# Patient Record
Sex: Male | Born: 1935 | Race: White | Hispanic: No | Marital: Single | Smoking: Former smoker
Health system: Southern US, Community
[De-identification: ages and names within clinical notes are randomized; demographics above are authoritative.]

## PROBLEM LIST (undated history)

## (undated) DIAGNOSIS — N289 Disorder of kidney and ureter, unspecified: Secondary | ICD-10-CM

## (undated) DIAGNOSIS — E1129 Type 2 diabetes mellitus with other diabetic kidney complication: Secondary | ICD-10-CM

## (undated) DIAGNOSIS — N183 Chronic kidney disease, stage 3 unspecified: Secondary | ICD-10-CM

## (undated) DIAGNOSIS — R0989 Other specified symptoms and signs involving the circulatory and respiratory systems: Secondary | ICD-10-CM

## (undated) DIAGNOSIS — E1140 Type 2 diabetes mellitus with diabetic neuropathy, unspecified: Secondary | ICD-10-CM

## (undated) DIAGNOSIS — B351 Tinea unguium: Secondary | ICD-10-CM

## (undated) DIAGNOSIS — E1142 Type 2 diabetes mellitus with diabetic polyneuropathy: Secondary | ICD-10-CM

## (undated) DIAGNOSIS — Z0000 Encounter for general adult medical examination without abnormal findings: Secondary | ICD-10-CM

## (undated) DIAGNOSIS — E669 Obesity, unspecified: Secondary | ICD-10-CM

## (undated) DIAGNOSIS — E782 Mixed hyperlipidemia: Secondary | ICD-10-CM

## (undated) DIAGNOSIS — E785 Hyperlipidemia, unspecified: Secondary | ICD-10-CM

## (undated) DIAGNOSIS — E739 Lactose intolerance, unspecified: Secondary | ICD-10-CM

## (undated) DIAGNOSIS — Z8619 Personal history of other infectious and parasitic diseases: Secondary | ICD-10-CM

## (undated) DIAGNOSIS — M5430 Sciatica, unspecified side: Secondary | ICD-10-CM

## (undated) DIAGNOSIS — K5790 Diverticulosis of intestine, part unspecified, without perforation or abscess without bleeding: Secondary | ICD-10-CM

## (undated) DIAGNOSIS — M4800 Spinal stenosis, site unspecified: Secondary | ICD-10-CM

## (undated) DIAGNOSIS — L0390 Cellulitis, unspecified: Secondary | ICD-10-CM

## (undated) DIAGNOSIS — E1149 Type 2 diabetes mellitus with other diabetic neurological complication: Secondary | ICD-10-CM

## (undated) DIAGNOSIS — R609 Edema, unspecified: Secondary | ICD-10-CM

## (undated) DIAGNOSIS — K219 Gastro-esophageal reflux disease without esophagitis: Secondary | ICD-10-CM

## (undated) DIAGNOSIS — E1165 Type 2 diabetes mellitus with hyperglycemia: Secondary | ICD-10-CM

## (undated) DIAGNOSIS — M1990 Unspecified osteoarthritis, unspecified site: Secondary | ICD-10-CM

## (undated) DIAGNOSIS — L0231 Cutaneous abscess of buttock: Secondary | ICD-10-CM

## (undated) DIAGNOSIS — I10 Essential (primary) hypertension: Secondary | ICD-10-CM

## (undated) HISTORY — DX: Essential (primary) hypertension: I10

## (undated) HISTORY — DX: Type 2 diabetes mellitus with other diabetic kidney complication: E11.29

## (undated) HISTORY — DX: Spinal stenosis, site unspecified: M48.00

## (undated) HISTORY — DX: Type 2 diabetes mellitus with diabetic neuropathy, unspecified: E11.40

## (undated) HISTORY — DX: Chronic kidney disease, stage 3 unspecified: N18.30

## (undated) HISTORY — DX: Gastro-esophageal reflux disease without esophagitis: K21.9

## (undated) HISTORY — DX: Lactose intolerance, unspecified: E73.9

## (undated) HISTORY — DX: Tinea unguium: B35.1

## (undated) HISTORY — DX: Type 2 diabetes mellitus with hyperglycemia: E11.65

## (undated) HISTORY — DX: Chronic kidney disease, stage 3 (moderate): N18.3

## (undated) HISTORY — DX: Sciatica, unspecified side: M54.30

## (undated) HISTORY — DX: Cutaneous abscess of buttock: L02.31

## (undated) HISTORY — PX: EYE SURGERY: SHX253

## (undated) HISTORY — PX: CATARACT EXTRACTION: SUR2

## (undated) HISTORY — PX: STERIOD INJECTION: SHX5046

## (undated) HISTORY — PX: RIB FRACTURE SURGERY: SHX2358

## (undated) HISTORY — DX: Cellulitis, unspecified: L03.90

## (undated) HISTORY — DX: Encounter for general adult medical examination without abnormal findings: Z00.00

## (undated) HISTORY — DX: Obesity, unspecified: E66.9

## (undated) HISTORY — DX: Type 2 diabetes mellitus with other diabetic neurological complication: E11.49

## (undated) HISTORY — DX: Disorder of kidney and ureter, unspecified: N28.9

## (undated) HISTORY — DX: Other specified symptoms and signs involving the circulatory and respiratory systems: R09.89

## (undated) HISTORY — DX: Diverticulosis of intestine, part unspecified, without perforation or abscess without bleeding: K57.90

## (undated) HISTORY — DX: Edema, unspecified: R60.9

## (undated) HISTORY — DX: Mixed hyperlipidemia: E78.2

## (undated) HISTORY — DX: Unspecified osteoarthritis, unspecified site: M19.90

## (undated) HISTORY — DX: Type 2 diabetes mellitus with diabetic polyneuropathy: E11.42

## (undated) HISTORY — PX: CYSTOSCOPY: SUR368

## (undated) HISTORY — DX: Hyperlipidemia, unspecified: E78.5

## (undated) HISTORY — DX: Personal history of other infectious and parasitic diseases: Z86.19

---

## 1957-08-19 HISTORY — PX: TONSILLECTOMY: SUR1361

## 1998-06-19 HISTORY — PX: COSMETIC SURGERY: SHX468

## 2000-08-19 HISTORY — PX: JOINT REPLACEMENT: SHX530

## 2000-08-19 HISTORY — PX: TOTAL HIP ARTHROPLASTY: SHX124

## 2001-02-23 ENCOUNTER — Ambulatory Visit (HOSPITAL_COMMUNITY): Admission: RE | Admit: 2001-02-23 | Discharge: 2001-02-23 | Payer: Self-pay | Admitting: Neurosurgery

## 2001-02-23 ENCOUNTER — Encounter: Payer: Self-pay | Admitting: Neurosurgery

## 2001-04-06 ENCOUNTER — Encounter: Payer: Self-pay | Admitting: Orthopedic Surgery

## 2001-04-09 ENCOUNTER — Encounter: Payer: Self-pay | Admitting: Orthopedic Surgery

## 2001-04-09 ENCOUNTER — Inpatient Hospital Stay (HOSPITAL_COMMUNITY): Admission: RE | Admit: 2001-04-09 | Discharge: 2001-04-18 | Payer: Self-pay | Admitting: Orthopedic Surgery

## 2001-04-23 ENCOUNTER — Ambulatory Visit (HOSPITAL_COMMUNITY): Admission: RE | Admit: 2001-04-23 | Discharge: 2001-04-23 | Payer: Self-pay | Admitting: Orthopedic Surgery

## 2002-09-07 ENCOUNTER — Encounter: Admission: RE | Admit: 2002-09-07 | Discharge: 2002-12-06 | Payer: Self-pay | Admitting: Internal Medicine

## 2006-02-10 ENCOUNTER — Encounter: Admission: RE | Admit: 2006-02-10 | Discharge: 2006-02-10 | Payer: Self-pay | Admitting: Internal Medicine

## 2011-10-17 ENCOUNTER — Ambulatory Visit (INDEPENDENT_AMBULATORY_CARE_PROVIDER_SITE_OTHER): Payer: MEDICARE | Admitting: General Surgery

## 2011-10-17 ENCOUNTER — Encounter (INDEPENDENT_AMBULATORY_CARE_PROVIDER_SITE_OTHER): Payer: Self-pay | Admitting: General Surgery

## 2011-10-17 VITALS — BP 140/70 | HR 72 | Temp 97.6°F | Resp 18 | Ht 71.0 in | Wt 234.6 lb

## 2011-10-17 DIAGNOSIS — T07XXXA Unspecified multiple injuries, initial encounter: Secondary | ICD-10-CM

## 2011-10-17 DIAGNOSIS — T148XXA Other injury of unspecified body region, initial encounter: Secondary | ICD-10-CM

## 2011-10-17 NOTE — Progress Notes (Signed)
Patient ID: Kyle Herman, male   DOB: Mar 12, 1936, 76 y.o.   MRN: [id number]  Chief Complaint  Patient presents with  . Follow-up    Buttock abscess    HPI Kyle Herman is a 76 y.o. male.  This patient was referred for evaluation of a nonhealing wound on his left buttock region. He states it has been present for approximately one year ever since he scratched off a lesion on his buttock region and since then he has had persistent drainage from the area. This is a nonhealing despite applying Neosporin daily as recommended by his permit a physician his main complaint is itching and burning in the area. He has a cortical uncontrolled diabetic with a sugars over 300 most times. HPI  Past Medical History  Diagnosis Date  . Type II or unspecified type diabetes mellitus with neurological manifestations, uncontrolled   . Diabetes mellitus   . Type II or unspecified type diabetes mellitus with renal manifestations, uncontrolled   . Chronic kidney disease, stage III (moderate)   . Polyneuropathy in diabetes   . Obesity, unspecified   . Abscess of buttock, left     Past Surgical History  Procedure Date  . Eye surgery 2011, 2012    Bilateral cataract removal  . Joint replacement 2002    left hip replaced  . Tonsillectomy 1959    Family History  Problem Relation Age of Onset  . Diabetes Sister   . Diabetes Brother   . Cancer Brother     Social History History  Substance Use Topics  . Smoking status: Former Smoker    Quit date: 10/16/1956  . Smokeless tobacco: Former Neurosurgeon    Quit date: 10/17/1971  . Alcohol Use: No    Allergies  Allergen Reactions  . Dairy Digestive Other (See Comments)    GI upset  . Diclofenac Sodium Rash    Face only  . Lopid (Gemfibrozil) Other (See Comments)    Neuropathy left thigh  . Zetia (Ezetimibe) Other (See Comments)    Myalgias  . Penicillins     Patient unsure of reaction. Happened many years ago.  . Metformin And Related Diarrhea  .  Simvastatin     Myalgias    Current Outpatient Prescriptions  Medication Sig Dispense Refill  . aspirin 81 MG tablet Take 81 mg by mouth daily.      . Cholecalciferol (VITAMIN D) 1000 UNITS capsule Take 1,000 Units by mouth daily.      Marland Kitchen neomycin-polymyxin-hydrocortisone (CORTISPORIN) 3.5-10000-1 otic suspension Place 2 drops into both ears as needed.      . Omega-3 Fatty Acids (FISH OIL) 1000 MG CAPS Take by mouth 3 (three) times daily.      . rosuvastatin (CRESTOR) 10 MG tablet Take 10 mg by mouth daily.      . Tamsulosin HCl (FLOMAX) 0.4 MG CAPS Take 0.4 mg by mouth daily.        Review of Systems Review of Systems All other review of systems negative or noncontributory except as stated in the HPI  Blood pressure 140/70, pulse 72, temperature 97.6 F (36.4 C), temperature source Temporal, resp. rate 18, height 5\' 11"  (1.803 m), weight 234 lb 9.6 oz (106.414 kg).  Physical Exam Physical Exam No acute distress nontoxic appearing  He has a 3cm x 3cm shallow ulcer on the left upper buttock region with healthy granulation tissue.  He also has some excoriation of the skin adjacent to the area with a midline fissure. There  is no evidence of fluctuance or induration or cellulitis or other sign of infection. Data Reviewed   Assessment    Nonhealing left buttock wound This is a clean, shallow ulcer which has been present for a year but there is no evidence of any active infection. The wound is very moist and I think that this is contributing to a lot of his symptoms of itching and burning and he needed to have some fungal infection but it is hard to tell today because the wound is moist. I would not recommend any skin grafted area and creating a second wound especially given his poorly controlled diabetes. I think that is also a large reason why this is not healed this point     Plan    I recommended daily dressing changes with dry gauze and to stop the Neosporin. I would like him to  try this wound up. I have also recommended that he be seen in the wound care clinic for further evaluation. Also adequate good control would be beneficial for healing the wound.        Lodema Pilot DAVID 10/17/2011, 3:58 PM

## 2011-10-23 ENCOUNTER — Encounter (HOSPITAL_BASED_OUTPATIENT_CLINIC_OR_DEPARTMENT_OTHER): Payer: Medicare Other | Attending: Plastic Surgery

## 2011-10-23 DIAGNOSIS — L89309 Pressure ulcer of unspecified buttock, unspecified stage: Secondary | ICD-10-CM | POA: Insufficient documentation

## 2011-10-23 DIAGNOSIS — L8990 Pressure ulcer of unspecified site, unspecified stage: Secondary | ICD-10-CM | POA: Insufficient documentation

## 2011-10-24 NOTE — Progress Notes (Signed)
[facility]  NAME:  Kyle Herman, Kyle Herman                 ACCOUNT NO.:  [id number]  MEDICAL RECORD NO.:  [id number]      DATE OF BIRTH:  09/08/1935  PHYSICIAN:  Wayland Denis, DO       VISIT DATE:  10/23/2011                                  OFFICE VISIT   CHIEF COMPLAINT:  Sacral ulcer.  HISTORY OF PRESENT ILLNESS:  Mr. Kyle Herman is a 76 year old gentleman who was referred to [place] by Dr. Pamala Hurry, general surgeon for evaluation of a nonhealing left buttock ulcer in the inferior portion of the buttock area.  He states that he has had it for a year.  The best that he can remember he felt something back there and scratched it, and never sense it has been a nonhealing ulcer.  He has also had a hip replacement on that side, so he does a lot of sitting, but he is ambulatory.  He has tried some Neosporin, which seemed to irritate it followed by steroid cream, and then most recently a dry dressing.  He does have diabetes which is poorly controlled.  The wound seems to be getting worse instead of better and has a surrounding rash as well.  PAST MEDICAL HISTORY:  Positive for: 1. Diabetes. 2. Renal disease. 3. Polyneuropathy. 4. Obesity. 5. Hyperlipidemia.  PAST SURGICAL HISTORY:  Bilateral cataract removal, left hip replacement, tonsillectomy.  FAMILY HISTORY:  Positive for diabetes and cancer in his sister and brother.  SOCIAL HISTORY:  He is a former smoker, but quit 40+ years ago and quit tobacco use 39 years ago.  He denies any alcohol use and he lives at home.  ALLERGIES: 1. DICLOFENAC. 2. LOPID. 3. ZETIA. 4. PENICILLIN. 5. METFORMIN. 6. SIMVASTATIN.  MEDICATIONS:  Aspirin, vitamin D, neomycin, omega-3 fatty acid, Crestor, Flomax.  REVIEW OF SYSTEMS:  He denies any significant change in his weight and recent illness, blood in his urine or stool, difficulty breathing or chest pain.  PHYSICAL EXAMINATION:  He is alert, oriented, and cooperative, not in any  acute distress.  He is accompanied by his son.  His pupils are equal.  His extraocular muscles are intact.  He does not have any cervical lymphadenopathy.  His breathing is unlabored.  His heart is regular.  His upper extremity pulses are strong and regular.  He has dry skin throughout.  His abdomen is large, but soft and nontender, unable to appreciate organomegaly.  Lower extremity; very mild edema, but not pitting.  Varicose veins noted.  His sacral area has a moderately superficial ulcer that goes down to subcutaneous tissue some, but not fat.  The full description is noted in the notes.  He has the periwound skin breakdown and looks a bit like a fungal infection with some raised redness and several areas of skin breakdown, so it looks like there are several things happening.  This is certainly concerning for a skin cancer due to the length of time that he has had this and the different stages of the surrounding area.  Punch biopsy with a 4-mm punch biopsy was done after a time-out was called and that information is noted in the notes as well.  We will use Aquacel AG for the following week.  He is encouraged multivitamin  him staying off the area, also wanted to check a hemoglobin A1c, prealbumin and he can use Lotrimin for the surrounding area that looks to be fungal and follow up in 1 week.     Wayland Denis, DO     CS/MEDQ  D:  10/23/2011  T:  10/24/2011  Job:  [id number]

## 2011-10-31 NOTE — Progress Notes (Signed)
[facility]  NAME:  KIPPER, BUCH NO.:  [id number]  MEDICAL RECORD NO.:  [id number]      DATE OF BIRTH:  [birth year]  PHYSICIAN:  Wayland Denis, DO       VISIT DATE:  10/30/2011                                  OFFICE VISIT   The patient is a 76 year old gentleman who is here for followup on his left gluteal ulcer.  We used Promogran last week with DuoDERM.  The biopsies came back negative for malignancy and were read as ulcer with inflamed granulation tissue.  His hemoglobin A1c came back at 11.11, so it is extremely high, but his prealbumin was high at 32.1, and overall, the wound does look better.  The peri-wound area is less irritated and red, and there is less of a rash than last week.  There has been no change in his medications.  REVIEW OF SYSTEMS:  Negative.  PHYSICAL EXAMINATION:  GENERAL:  He is alert and oriented, cooperative, not in any acute distress.  He is pleasant. HEENT:  Pupils are equal.  Extraocular muscles are intact. NECK:  No cervical lymphadenopathy. LUNGS:  His breathing is unlabored. HEART:  Regular. ABDOMEN:  Large but soft.  The wound is noted above and described in the notes.  We will continue with Promogran and DuoDERM.  Recommend that he stay off of it as much as possible.  Increase protein.  Decrease sugar and fat and multivitamin.     Wayland Denis, DO     CS/MEDQ  D:  10/30/2011  T:  10/31/2011  Job:  [id number]

## 2011-11-07 NOTE — Progress Notes (Signed)
[facility]  NAME:  DELNO, BLAISDELL NO.:  [id number]  MEDICAL RECORD NO.:  [id number]      DATE OF BIRTH:  02/10/1936  PHYSICIAN:  Wayland Denis, DO       VISIT DATE:  11/06/2011                                  OFFICE VISIT   Kyle Herman is a 76 year old gentleman who has pressure ulcer on his left buttock area.  He has had difficulty with getting this healed.  He tries to stay off it, but it is difficult to do.  He has been using Promogran and DuoDERM, and it does show some improvement since the last visit. There is still some periwound redness, but overall, it is improving. There is no change in his medication.  SOCIAL HISTORY:  Unchanged.  PHYSICAL EXAMINATION:  He is alert and oriented, cooperative, not in any acute distress.  He is very pleasant.  His pupils are equal. Extraocular muscles are intact.  No cervical lymphadenopathy.  His breathing is unlabored.  His heart is regular.  His abdomen is soft.  We will continue with Promogran and DuoDERM and we will have him follow up in 1 week.     Wayland Denis, DO     CS/MEDQ  D:  11/06/2011  T:  11/07/2011  Job:  [id number]

## 2011-11-14 NOTE — Progress Notes (Signed)
[facility]  NAME:  Kyle Herman, Kyle Herman NO.:  [id number]  MEDICAL RECORD NO.:  [id number]      DATE OF BIRTH:  July 21, 1936  PHYSICIAN:  Wayland Denis, DO       VISIT DATE:  11/13/2011                                  OFFICE VISIT   The patient is a 76 year old gentleman who is here for followup on his left sacral ulcer.  He has been using collagen and Hydrogel.  The entire area has improved, particularly the periwound area.  The actual wound is improved and is smaller and epithelializing, so overall he is doing much better.  There has been no change in his medications or social history.  PHYSICAL EXAMINATION:  GENERAL:  He is a alert and oriented, cooperative, not in any acute distress.  He is pleasant. HEENT:  Pupils are equal.  Extraocular muscles are intact. NECK:  No cervical lymphadenopathy. LUNGS:  His breathing is unlabored. CARDIAC:  His heart is regular. ABDOMEN:  Soft. EXTREMITIES:  He does complain of left lower extremity pain, but does feel better when he puts it down.  We will continue with the collagen and Hydrogel and have him follow up in 3 weeks.  Of course, he needs to be sure not to smoke, increase his protein and stay off of the area.     Wayland Denis, DO     CS/MEDQ  D:  11/13/2011  T:  11/14/2011  Job:  [id number]

## 2011-11-20 ENCOUNTER — Encounter (HOSPITAL_BASED_OUTPATIENT_CLINIC_OR_DEPARTMENT_OTHER): Payer: Medicare Other

## 2011-12-04 ENCOUNTER — Encounter (HOSPITAL_BASED_OUTPATIENT_CLINIC_OR_DEPARTMENT_OTHER): Payer: Medicare Other | Attending: Plastic Surgery

## 2011-12-04 DIAGNOSIS — L98499 Non-pressure chronic ulcer of skin of other sites with unspecified severity: Secondary | ICD-10-CM | POA: Insufficient documentation

## 2011-12-04 NOTE — Progress Notes (Signed)
[facility]  NAME:  IZIC, STFORT NO.:  [id number]  MEDICAL RECORD NO.:  [id number]      DATE OF BIRTH:  1936/01/14  PHYSICIAN:  Wayland Denis, DO       VISIT DATE:  12/04/2011                                  OFFICE VISIT   The patient is a 76 year old gentleman who is here for followup on his sacral ulcer.  He has been using collagen and Hydrogel with very good success and has at this point completely healed.  On exam, he is alert, oriented, cooperative, not in any acute distress. He is pleasant.  His pupils are equal.  His extraocular muscles are intact.  He does not have any cervical lymphadenopathy.  The wound is healed.  Recommend continuing with positioning and offloading as he could have some breakdown if he does not take care this over the next 6 weeks.  He acknowledged understanding and agreeing with the plan and we will see him back as needed.     Wayland Denis, DO     CS/MEDQ  D:  12/04/2011  T:  12/04/2011  Job:  [id number]

## 2012-12-08 ENCOUNTER — Other Ambulatory Visit: Payer: Self-pay | Admitting: Nephrology

## 2012-12-08 DIAGNOSIS — N184 Chronic kidney disease, stage 4 (severe): Secondary | ICD-10-CM

## 2012-12-10 ENCOUNTER — Ambulatory Visit
Admission: RE | Admit: 2012-12-10 | Discharge: 2012-12-10 | Disposition: A | Discharge status: Home / Self Care | Payer: Medicare Other | Source: Ambulatory Visit | Attending: Nephrology | Admitting: Nephrology

## 2012-12-10 DIAGNOSIS — N184 Chronic kidney disease, stage 4 (severe): Secondary | ICD-10-CM

## 2012-12-16 ENCOUNTER — Ambulatory Visit
Admission: RE | Admit: 2012-12-16 | Discharge: 2012-12-16 | Disposition: A | Discharge status: Home / Self Care | Payer: Medicare Other | Source: Ambulatory Visit | Attending: Internal Medicine | Admitting: Internal Medicine

## 2012-12-16 ENCOUNTER — Other Ambulatory Visit: Payer: Self-pay | Admitting: Internal Medicine

## 2012-12-16 DIAGNOSIS — R42 Dizziness and giddiness: Secondary | ICD-10-CM

## 2013-02-05 ENCOUNTER — Other Ambulatory Visit: Payer: Self-pay

## 2013-02-05 ENCOUNTER — Encounter: Payer: Self-pay | Admitting: Vascular Surgery

## 2013-03-04 ENCOUNTER — Encounter: Payer: Self-pay | Admitting: Vascular Surgery

## 2013-03-05 ENCOUNTER — Other Ambulatory Visit (INDEPENDENT_AMBULATORY_CARE_PROVIDER_SITE_OTHER): Payer: Medicare Other | Admitting: *Deleted

## 2013-03-05 ENCOUNTER — Encounter: Payer: Self-pay | Admitting: Vascular Surgery

## 2013-03-05 ENCOUNTER — Ambulatory Visit (INDEPENDENT_AMBULATORY_CARE_PROVIDER_SITE_OTHER): Payer: Medicare Other | Admitting: Vascular Surgery

## 2013-03-05 DIAGNOSIS — I6529 Occlusion and stenosis of unspecified carotid artery: Secondary | ICD-10-CM

## 2013-03-05 MED ORDER — CLOPIDOGREL BISULFATE 75 MG PO TABS: 75.0000 mg | ORAL_TABLET | Freq: Every day | ORAL | Status: DC

## 2013-03-05 NOTE — Progress Notes (Signed)
[facility]  New Carotid Patient  Referred by:  Marden Noble, MD [address] [place], [place] [address]  Reason for referral: Left carotid stenosis  History of Present Illness  Kyle Herman is a 77 y.o. ([birth year]) male who presents with chief complaint: "narrowing in neck".  Previous carotid studies demonstrated: RICA 30-40% stenosis, LICA 50-69% stenosis.  Patient has no history of TIA or stroke symptom.  The patient has nevern had amaurosis fugax or monocular blindness.  The patient has never had facial drooping or hemiplegia.  The patient has never had receptive or expressive aphasia.   The patient's risks factors for carotid disease include: IDDM, HTN and prior smoking.  The patient has been on chronic surveillance and only recently has had progression in his studies.  Pt notes most recent GFR < 15 at nephrologist's office.  The patient has stated he does not want hemodialysis.  Past Medical History  Diagnosis Date  . Type II or unspecified type diabetes mellitus with neurological manifestations, uncontrolled(250.62)   . Diabetes mellitus   . Type II or unspecified type diabetes mellitus with renal manifestations, uncontrolled(250.42)   . Chronic kidney disease, stage III (moderate)   . Polyneuropathy in diabetes(357.2)   . Obesity, unspecified   . Abscess of buttock, left   . Hypertension   . Spinal stenosis   . Sciatica   . H/O onychomycosis   . Left carotid bruit   . Diverticulosis   . DJD (degenerative joint disease)     Past Surgical History  Procedure Laterality Date  . Eye surgery  2011, 2012    Bilateral cataract removal  . Joint replacement  2002    left hip replaced  . Tonsillectomy  1959  . Total hip arthroplasty Left 2002  . Cosmetic surgery  06/1998    for a chain saw injury to left leg    History   Social History  . Marital Status: Single    Spouse Name: N/A    Number of Children: N/A  . Years of  Education: N/A   Occupational History  . Not on file.   Social History Main Topics  . Smoking status: Former Smoker    Quit date: 10/16/1996  . Smokeless tobacco: Former Neurosurgeon    Quit date: 10/17/1971  . Alcohol Use: No  . Drug Use: No  . Sexually Active: Not on file   Other Topics Concern  . Not on file   Social History Narrative  . No narrative on file    Family History  Problem Relation Age of Onset  . Diabetes Sister   . Hypertension Sister   . Diabetes Brother   . Cancer Brother   . Hypertension Brother   . Diabetes Mother   . Hypertension Mother   . Heart attack Mother   . Diabetes Father   . Hypertension Father   . Heart attack Father     Current Outpatient Prescriptions on File Prior to Visit  Medication Sig Dispense Refill  . aspirin 81 MG tablet Take 81 mg by mouth daily.      . Cholecalciferol (VITAMIN D) 1000 UNITS capsule Take 1,000 Units by mouth daily.      . insulin NPH-regular (HUMULIN 70/30) (70-30) 100 UNIT/ML injection Inject 48 Units into the skin 3 (three) times daily.      . Omega-3 Fatty Acids (FISH OIL) 1000 MG CAPS Take by mouth 3 (three) times daily.      Marland Kitchen  rosuvastatin (CRESTOR) 10 MG tablet Take 10 mg by mouth daily.      . Tamsulosin HCl (FLOMAX) 0.4 MG CAPS Take 0.4 mg by mouth daily.      Marland Kitchen linagliptin (TRADJENTA) 5 MG TABS tablet Take 5 mg by mouth daily.      Marland Kitchen neomycin-polymyxin-hydrocortisone (CORTISPORIN) 3.5-10000-1 otic suspension Place 2 drops into both ears as needed.      . traMADol (ULTRAM) 50 MG tablet Take 50 mg by mouth every 6 (six) hours as needed for pain.       No current facility-administered medications on file prior to visit.    Allergies  Allergen Reactions  . Diclofenac Sodium Rash    Face only  . Lactase Other (See Comments)    GI upset  . Lopid (Gemfibrozil) Other (See Comments)    Neuropathy left thigh  . Zetia (Ezetimibe) Other (See Comments)    Myalgias  . Penicillins     Patient unsure of  reaction. Happened many years ago.  . Metformin And Related Diarrhea  . Simvastatin     Myalgias    REVIEW OF SYSTEMS:  (Positives checked otherwise negative)  CARDIOVASCULAR:  []  chest pain, []  chest pressure, []  palpitations, []  shortness of breath when laying flat, []  shortness of breath with exertion,  [x]  pain in feet when walking, [x]  pain in feet when laying flat, []  history of blood clot in veins (DVT), []  history of phlebitis, []  swelling in legs, []  varicose veins  PULMONARY:  []  productive cough, []  asthma, []  wheezing  NEUROLOGIC:  [x]  weakness in arms or legs, [x]  numbness in arms or legs, []  difficulty speaking or slurred speech, []  temporary loss of vision in one eye, [x]  dizziness  HEMATOLOGIC:  []  bleeding problems, []  problems with blood clotting too easily  MUSCULOSKEL:  []  joint pain, []  joint swelling  GASTROINTEST:  []  vomiting blood, []  blood in stool     GENITOURINARY:  []  burning with urination, [x]  blood in urine  PSYCHIATRIC:  []  history of major depression  INTEGUMENTARY:  [x]  rashes, []  ulcers  CONSTITUTIONAL:  []  fever, []  chills  For VQI Use Only  PRE-ADM LIVING: Home  AMB STATUS: Ambulatory  CAD Sx: None  PRIOR CHF: None  STRESS TEST: [x]  No, [ ]  Normal, [ ]  + ischemia, [ ]  + MI, [ ]  Both  Physical Examination  Filed Vitals:   03/05/13 0905 03/05/13 0907  BP: 177/78 172/73  Pulse: 80   Height: 5\' 11"  (1.803 m)   Weight: 234 lb (106.142 kg)   SpO2: 97%    Body mass index is 32.65 kg/(m^2).  General: A&O x 3, WD, Obese,   Head: [place]/AT  Ear/Nose/Throat: Hearing grossly intact, nares w/o erythema or drainage, oropharynx w/o Erythema/Exudate, Mallampati score:   Eyes: PERRLA, EOMI, Post surg chg to pupils,   Neck: Supple, no nuchal rigidity, no palpable LAD  Pulmonary: Sym exp, good air movt, CTAB, no rales, rhonchi, & wheezing  Cardiac: RRR, Nl S1, S2, no Murmurs, rubs or gallops  Vascular: Vessel Right Left  Radial   Palpable  Palpable  Brachial  Palpable  Palpable  Carotid  Palpable, without bruit  Palpable, without bruit  Aorta  Not palpable N/A  Femoral  Palpable  Palpable  Popliteal  Not palpable  Not palpable  PT Faintly Palpable  Faintly Palpable  DP Not Palpable Not Palpable   Gastrointestinal: soft, NTND, -G/R, - HSM, - masses, - CVAT B, large pannus, unable to palpate aorta  Musculoskeletal: M/S 5/5 throughout , Extremities without ischemic changes   Neurologic: CN 2-12 intact , Pain and light touch intact in extremities except decreased sensation in feet, Motor exam as listed above  Psychiatric: Judgment intact, Mood & affect appropriate for pt's clinical situation  Dermatologic: See M/S exam for extremity exam, no rashes otherwise noted  Lymph : No Cervical, Axillary, or Inguinal lymphadenopathy   Non-Invasive Vascular Imaging  L CAROTID DUPLEX (Date: 03/05/2013):   L CCA: 481/178 c/s  L ICA stenosis: 60-79%  L VA:  patent and antegrade  Outside Studies/Documentation 10 pages of outside documents were reviewed including: outside clinic chart and labs reviewed.  Medical Decision Making  Kyle Herman is a 77 y.o. male who presents with: asx likely high grade L CCA and ICA stenoses, low grade R ICA, CKD stage V   The high grade CCA stenosis will decrease the velocity at the level of the LICA, suggesting the LICA is likely >80%.  The only way to confirm the extent and degree of stenosis would be a L carotid angiogram or neck CTA.  He is not a candidate for CTA due to his stage V CKD.  Unfortunately, this patient is already stage V with no intent to go on to hemodialysis, so contrast induce nephropathy from the carotid angiogram could be a death sentence for this patient.  I do not feel comfortable operating on this patient without knowing the extent of his disease, as if he has proximal CCA disease, a proximal carotid stent placement might be needed in addition to L  CEA.  Additional, expert opinion is that a patient doesn't really benefit from CEA unless a 3-5 year lifespan can be expected.  I'm not certain in this patient that he will meet this time frame.  At this point, the pt is not interested in proceeding with L carotid angiogram, so we will proceed with maximal medical mgmt.  I discussed in depth with the patient the nature of atherosclerosis, and emphasized the importance of maximal medical management including strict control of blood pressure, blood glucose, and lipid levels, obtaining regular exercise, antiplatelet agents, and cessation of smoking.    The patient is currently on an antiplatelet: ASA.  I am also adding Plavix to his regimen.  The patient is currently on a statin: Crestor.    The patient is aware that without maximal medical management the underlying atherosclerotic disease process will progress, limiting the benefit of any interventions.  The pt will contact us if he changes his mind.  Thank you for allowing [place] to participate in this patient's care.  Leonides Sake, MD [facility] Office: [phone] Pager: [phone]  03/05/2013, 9:47 AM

## 2013-03-10 ENCOUNTER — Encounter: Payer: Self-pay | Admitting: Family Medicine

## 2013-05-25 ENCOUNTER — Other Ambulatory Visit (HOSPITAL_COMMUNITY): Payer: Self-pay | Admitting: *Deleted

## 2013-05-26 ENCOUNTER — Ambulatory Visit (HOSPITAL_COMMUNITY)
Admission: RE | Admit: 2013-05-26 | Discharge: 2013-05-26 | Disposition: A | Discharge status: Home / Self Care | Payer: Medicare Other | Source: Ambulatory Visit | Attending: Nephrology | Admitting: Nephrology

## 2013-05-26 DIAGNOSIS — D509 Iron deficiency anemia, unspecified: Secondary | ICD-10-CM | POA: Insufficient documentation

## 2013-05-26 MED ORDER — SODIUM CHLORIDE 0.9 % IV SOLN
1020.0000 mg | Freq: Once | INTRAVENOUS | Status: AC
  Administered 2013-05-26: 1020 mg via INTRAVENOUS
  Filled 2013-05-26: qty 34

## 2013-06-02 ENCOUNTER — Encounter (HOSPITAL_COMMUNITY): Payer: Self-pay | Admitting: Emergency Medicine

## 2013-06-02 ENCOUNTER — Emergency Department (HOSPITAL_COMMUNITY)
Admission: EM | Admit: 2013-06-02 | Discharge: 2013-06-03 | Disposition: A | Discharge status: Home / Self Care | Payer: Medicare Other | Attending: Emergency Medicine | Admitting: Emergency Medicine

## 2013-06-02 DIAGNOSIS — E1165 Type 2 diabetes mellitus with hyperglycemia: Secondary | ICD-10-CM | POA: Insufficient documentation

## 2013-06-02 DIAGNOSIS — E669 Obesity, unspecified: Secondary | ICD-10-CM | POA: Insufficient documentation

## 2013-06-02 DIAGNOSIS — G8929 Other chronic pain: Secondary | ICD-10-CM | POA: Insufficient documentation

## 2013-06-02 DIAGNOSIS — Z794 Long term (current) use of insulin: Secondary | ICD-10-CM | POA: Insufficient documentation

## 2013-06-02 DIAGNOSIS — E1129 Type 2 diabetes mellitus with other diabetic kidney complication: Secondary | ICD-10-CM | POA: Insufficient documentation

## 2013-06-02 DIAGNOSIS — R609 Edema, unspecified: Secondary | ICD-10-CM

## 2013-06-02 DIAGNOSIS — Z8719 Personal history of other diseases of the digestive system: Secondary | ICD-10-CM | POA: Insufficient documentation

## 2013-06-02 DIAGNOSIS — Z87891 Personal history of nicotine dependence: Secondary | ICD-10-CM | POA: Insufficient documentation

## 2013-06-02 DIAGNOSIS — I509 Heart failure, unspecified: Secondary | ICD-10-CM | POA: Insufficient documentation

## 2013-06-02 DIAGNOSIS — Z880 Allergy status to penicillin: Secondary | ICD-10-CM | POA: Insufficient documentation

## 2013-06-02 DIAGNOSIS — Z79899 Other long term (current) drug therapy: Secondary | ICD-10-CM | POA: Insufficient documentation

## 2013-06-02 DIAGNOSIS — L02419 Cutaneous abscess of limb, unspecified: Secondary | ICD-10-CM | POA: Insufficient documentation

## 2013-06-02 DIAGNOSIS — Z8619 Personal history of other infectious and parasitic diseases: Secondary | ICD-10-CM | POA: Insufficient documentation

## 2013-06-02 DIAGNOSIS — E1142 Type 2 diabetes mellitus with diabetic polyneuropathy: Secondary | ICD-10-CM | POA: Insufficient documentation

## 2013-06-02 DIAGNOSIS — E1149 Type 2 diabetes mellitus with other diabetic neurological complication: Secondary | ICD-10-CM | POA: Insufficient documentation

## 2013-06-02 DIAGNOSIS — L0390 Cellulitis, unspecified: Secondary | ICD-10-CM

## 2013-06-02 DIAGNOSIS — Z7982 Long term (current) use of aspirin: Secondary | ICD-10-CM | POA: Insufficient documentation

## 2013-06-02 DIAGNOSIS — N183 Chronic kidney disease, stage 3 unspecified: Secondary | ICD-10-CM | POA: Insufficient documentation

## 2013-06-02 DIAGNOSIS — I129 Hypertensive chronic kidney disease with stage 1 through stage 4 chronic kidney disease, or unspecified chronic kidney disease: Secondary | ICD-10-CM | POA: Insufficient documentation

## 2013-06-02 LAB — CBC
HCT: 34.8 % — ABNORMAL LOW (ref 39.0–52.0)
Hemoglobin: 11.7 g/dL — ABNORMAL LOW (ref 13.0–17.0)
MCH: 29.8 pg (ref 26.0–34.0)
MCHC: 33.6 g/dL (ref 30.0–36.0)
MCV: 88.8 fL (ref 78.0–100.0)
RBC: 3.92 MIL/uL — ABNORMAL LOW (ref 4.22–5.81)
RDW: 14.7 % (ref 11.5–15.5)
WBC: 13 10*3/uL — ABNORMAL HIGH (ref 4.0–10.5)

## 2013-06-02 LAB — BASIC METABOLIC PANEL
BUN: 53 mg/dL — ABNORMAL HIGH (ref 6–23)
Chloride: 96 mEq/L (ref 96–112)
Creatinine, Ser: 3.67 mg/dL — ABNORMAL HIGH (ref 0.50–1.35)
GFR calc Af Amer: 17 mL/min — ABNORMAL LOW (ref >90)
GFR calc non Af Amer: 15 mL/min — ABNORMAL LOW (ref >90)
Glucose, Bld: 281 mg/dL — ABNORMAL HIGH (ref 70–99)

## 2013-06-02 LAB — POCT I-STAT TROPONIN I

## 2013-06-02 NOTE — ED Notes (Signed)
Pt states he has been having swelling in both of his legs that is causing pain, and caused him to fall in the bathroom.

## 2013-06-03 ENCOUNTER — Encounter (HOSPITAL_COMMUNITY): Payer: Self-pay | Admitting: Emergency Medicine

## 2013-06-03 ENCOUNTER — Emergency Department (HOSPITAL_COMMUNITY): Payer: Medicare Other

## 2013-06-03 LAB — URINALYSIS, ROUTINE W REFLEX MICROSCOPIC
Bilirubin Urine: NEGATIVE
Glucose, UA: 250 mg/dL — AB
Ketones, ur: NEGATIVE mg/dL
Leukocytes, UA: NEGATIVE
Nitrite: NEGATIVE
Specific Gravity, Urine: 1.021 (ref 1.005–1.030)
pH: 5.5 (ref 5.0–8.0)

## 2013-06-03 LAB — DIFFERENTIAL
Basophils Absolute: 0 10*3/uL (ref 0.0–0.1)
Eosinophils Relative: 0 % (ref 0–5)
Lymphocytes Relative: 5 % — ABNORMAL LOW (ref 12–46)
Lymphs Abs: 0.7 10*3/uL (ref 0.7–4.0)
Monocytes Absolute: 0.9 10*3/uL (ref 0.1–1.0)
Neutro Abs: 11.4 10*3/uL — ABNORMAL HIGH (ref 1.7–7.7)

## 2013-06-03 LAB — URINE MICROSCOPIC-ADD ON

## 2013-06-03 MED ORDER — HYDROCODONE-ACETAMINOPHEN 5-325 MG PO TABS: 1.0000 | ORAL_TABLET | ORAL | Status: AC | PRN

## 2013-06-03 MED ORDER — HYDROCODONE-ACETAMINOPHEN 5-325 MG PO TABS
1.0000 | ORAL_TABLET | Freq: Once | ORAL | Status: AC
  Administered 2013-06-03: 1 via ORAL
  Filled 2013-06-03: qty 1

## 2013-06-03 MED ORDER — CEPHALEXIN 500 MG PO CAPS: 500.0000 mg | ORAL_CAPSULE | Freq: Four times a day (QID) | ORAL | Status: AC

## 2013-06-03 NOTE — ED Provider Notes (Signed)
CSN: [id number]     Arrival date & time 06/02/13  1841 History   First MD Initiated Contact with Patient 06/02/13 2347     Chief Complaint  Patient presents with  . Leg Swelling  . Congestive Heart Failure   (Consider location/radiation/quality/duration/timing/severity/associated sxs/prior Treatment) Patient is a 77 y.o. male presenting with leg pain. The history is provided by the patient. No language interpreter was used.  Leg Pain Location:  Leg Leg location:  L lower leg and R lower leg Chronicity:  Chronic Associated symptoms: no fever   Associated symptoms comment:  He has had pain and swelling in the lower legs chronically that is increasingly painful. He reports having been put on Lasix with increasing doses without relief. No SOB, chest pain. Tonight he could not hold up his weight and slid to the floor. Per his son, he does not take anything for pain because of his kidney disease.   Past Medical History  Diagnosis Date  . Type II or unspecified type diabetes mellitus with neurological manifestations, uncontrolled(250.62)   . Diabetes mellitus   . Type II or unspecified type diabetes mellitus with renal manifestations, uncontrolled(250.42)   . Chronic kidney disease, stage III (moderate)   . Polyneuropathy in diabetes(357.2)   . Obesity, unspecified   . Abscess of buttock, left   . Hypertension   . Spinal stenosis   . Sciatica   . H/O onychomycosis   . Left carotid bruit   . Diverticulosis   . DJD (degenerative joint disease)    Past Surgical History  Procedure Laterality Date  . Eye surgery  2011, 2012    Bilateral cataract removal  . Joint replacement  2002    left hip replaced  . Tonsillectomy  1959  . Total hip arthroplasty Left 2002  . Cosmetic surgery  06/1998    for a chain saw injury to left leg   Family History  Problem Relation Age of Onset  . Diabetes Sister   . Hypertension Sister   . Diabetes Brother   . Cancer Brother   . Hypertension  Brother   . Diabetes Mother   . Hypertension Mother   . Heart attack Mother   . Diabetes Father   . Hypertension Father   . Heart attack Father    History  Substance Use Topics  . Smoking status: Former Smoker    Quit date: 10/16/1996  . Smokeless tobacco: Former Neurosurgeon    Quit date: 10/17/1971  . Alcohol Use: No    Review of Systems  Constitutional: Negative for fever and chills.  HENT: Negative.   Respiratory: Negative.  Negative for shortness of breath.   Cardiovascular: Positive for leg swelling. Negative for chest pain.  Gastrointestinal: Negative.   Musculoskeletal:       See HPI  Skin: Negative.   Neurological: Negative.     Allergies  Diclofenac sodium; Lactase; Lopid; Zetia; Penicillins; Metformin and related; and Simvastatin  Home Medications   Current Outpatient Rx  Name  Route  Sig  Dispense  Refill  . aspirin 81 MG tablet   Oral   Take 81 mg by mouth daily.         . carvedilol (COREG) 12.5 MG tablet   Oral   Take 1 tablet by mouth 2 (two) times daily.         . Cholecalciferol (VITAMIN D) 1000 UNITS capsule   Oral   Take 1,000 Units by mouth daily.         Marland Kitchen  clopidogrel (PLAVIX) 75 MG tablet   Oral   Take 1 tablet (75 mg total) by mouth daily.   30 tablet   11   . doxazosin (CARDURA) 4 MG tablet   Oral   Take 4 mg by mouth.          . furosemide (LASIX) 40 MG tablet   Oral   Take 0.5 tablets by mouth daily.          . hydrALAZINE (APRESOLINE) 25 MG tablet   Oral   Take 1 tablet by mouth 3 (three) times daily.         . insulin NPH-regular (HUMULIN 70/30) (70-30) 100 UNIT/ML injection   Subcutaneous   Inject 36 Units into the skin 3 (three) times daily.          . metoCLOPramide (REGLAN) 5 MG tablet   Oral   Take 5 mg by mouth 3 (three) times daily.         Marland Kitchen neomycin-polymyxin-hydrocortisone (CORTISPORIN) 3.5-10000-1 otic suspension   Both Ears   Place 2 drops into both ears as needed.         . Omega-3 Fatty  Acids (FISH OIL) 1000 MG CAPS   Oral   Take 1 capsule by mouth at bedtime.          . potassium chloride (K-DUR) 10 MEQ tablet   Oral   Take 10 mEq by mouth daily.         . rosuvastatin (CRESTOR) 10 MG tablet   Oral   Take 10 mg by mouth daily.         . Tamsulosin HCl (FLOMAX) 0.4 MG CAPS   Oral   Take 0.4 mg by mouth daily.         . vitamin B-12 (CYANOCOBALAMIN) 1000 MCG tablet   Oral   Take 1,000 mcg by mouth daily.          BP 152/73  Pulse 77  Temp(Src) 98.6 F (37 C) (Oral)  Resp 16  Wt 236 lb 12.8 oz (107.412 kg)  BMI 33.04 kg/m2  SpO2 95% Physical Exam  Constitutional: He is oriented to person, place, and time. He appears well-developed and well-nourished. No distress.  HENT:  Head: Normocephalic.  Neck: Normal range of motion. Neck supple.  Cardiovascular: Normal rate and regular rhythm.   Pulmonary/Chest: Effort normal and breath sounds normal.  Abdominal: Soft. Bowel sounds are normal. There is no tenderness. There is no rebound and no guarding.  Musculoskeletal: Normal range of motion.  Lower legs and feet swollen bilaterally, equally tender. Left leg mildly erythematous. No drainage, blistering or lesion.   Neurological: He is alert and oriented to person, place, and time.  Skin: Skin is warm and dry. There is erythema.  Psychiatric: He has a normal mood and affect.    ED Course  Procedures (including critical care time) Labs Review Labs Reviewed  CBC - Abnormal; Notable for the following:    WBC 13.0 (*)    RBC 3.92 (*)    Hemoglobin 11.7 (*)    HCT 34.8 (*)    All other components within normal limits  BASIC METABOLIC PANEL - Abnormal; Notable for the following:    Sodium 134 (*)    Glucose, Bld 281 (*)    BUN 53 (*)    Creatinine, Ser 3.67 (*)    GFR calc non Af Amer 15 (*)    GFR calc Af Amer 17 (*)    All other components  within normal limits  PRO B NATRIURETIC PEPTIDE - Abnormal; Notable for the following:    Pro B  Natriuretic peptide (BNP) 562.5 (*)    All other components within normal limits  URINALYSIS, ROUTINE W REFLEX MICROSCOPIC  POCT I-STAT TROPONIN I   Results for orders placed during the hospital encounter of 06/02/13  CBC      Result Value Range   WBC 13.0 (*) 4.0 - 10.5 K/uL   RBC 3.92 (*) 4.22 - 5.81 MIL/uL   Hemoglobin 11.7 (*) 13.0 - 17.0 g/dL   HCT 16.1 (*) 09.6 - 04.5 %   MCV 88.8  78.0 - 100.0 fL   MCH 29.8  26.0 - 34.0 pg   MCHC 33.6  30.0 - 36.0 g/dL   RDW 40.9  81.1 - 91.4 %   Platelets 204  150 - 400 K/uL  BASIC METABOLIC PANEL      Result Value Range   Sodium 134 (*) 135 - 145 mEq/L   Potassium 4.4  3.5 - 5.1 mEq/L   Chloride 96  96 - 112 mEq/L   CO2 23  19 - 32 mEq/L   Glucose, Bld 281 (*) 70 - 99 mg/dL   BUN 53 (*) 6 - 23 mg/dL   Creatinine, Ser 7.82 (*) 0.50 - 1.35 mg/dL   Calcium 9.2  8.4 - 95.6 mg/dL   GFR calc non Af Amer 15 (*) >90 mL/min   GFR calc Af Amer 17 (*) >90 mL/min  PRO B NATRIURETIC PEPTIDE      Result Value Range   Pro B Natriuretic peptide (BNP) 562.5 (*) 0 - 125 pg/mL  DIFFERENTIAL      Result Value Range   Neutrophils Relative % 87 (*) 43 - 77 %   Neutro Abs 11.4 (*) 1.7 - 7.7 K/uL   Lymphocytes Relative 5 (*) 12 - 46 %   Lymphs Abs 0.7  0.7 - 4.0 K/uL   Monocytes Relative 7  3 - 12 %   Monocytes Absolute 0.9  0.1 - 1.0 K/uL   Eosinophils Relative 0  0 - 5 %   Eosinophils Absolute 0.0  0.0 - 0.7 K/uL   Basophils Relative 0  0 - 1 %   Basophils Absolute 0.0  0.0 - 0.1 K/uL  POCT I-STAT TROPONIN I      Result Value Range   Troponin i, poc 0.02  0.00 - 0.08 ng/mL   Comment 3            Dg Chest 2 View  06/03/2013   CLINICAL DATA:  Pain and swelling of legs. History of CHF, diabetes and obesity.  EXAM: CHEST  2 VIEW  COMPARISON:  None.  FINDINGS: Borderline enlarged heart. No infiltrates or failure. No effusion or pneumothorax. Negative osseous structures. Moderate tortuosity of the aorta.  IMPRESSION: Borderline cardiomegaly, no active  disease.   Electronically Signed   By: Davonna Belling M.D.   On: 06/03/2013 01:24   Imaging Review No results found.  EKG Interpretation   None       MDM  No diagnosis found. 1. Peripheral edema 2. Mild left LE cellulitis   Chronic lower extremity pain and swelling, currently being treated by PCP, however with mild leukocytosis and slightly red left greater than right LE. Consider mild cellulitis. He is ambulating well, without imbalance. No fever. Discussed with family who is comfortable with caring for patient at home. Will start on abx. Follow up with PCP in 1-2 days for  recheck.    Arnoldo Hooker, PA-C 06/03/13 [phone]

## 2013-06-03 NOTE — ED Notes (Signed)
Patient transported to X-ray 

## 2013-06-03 NOTE — ED Provider Notes (Signed)
Medical screening examination/treatment/procedure(s) were conducted as a shared visit with non-physician practitioner(s) and myself.  I personally evaluated the patient during the encounter.  Patient is a 77 y.o. male with a history of congestive heart failure who presents emergency department with bilateral lower extremity swelling and pain that has been present for several months. He reports today when he stood up his pain was worse. Due to pain he became diaphoretic and slowly fell down to the ground. He did not strike his head. Denies any numbness, tingling or focal weakness. On exam, patient has venous stasis dermatitis, bilateral edema to the level of his knees, 2+ DP pulses bilaterally, slightly diminished sensation to light touch which he reports is chronic. There is mild erythema and warmth of the left lower extremity compared to the right. Patient is hemodynamically stable. He does have a mild leukocytosis with left shift. Given this is a chronic condition, do not feel he needs diuresis or ultrasound of his lower extremities at this time. Have offered admission but family would like discharge home. We'll discharge on antibiotics for early cellulitis. Given return precautions. Patient and family verbalized understanding and are comfortable with plan.   Layla Maw Russie Gulledge, DO 06/03/13 [phone]

## 2014-02-14 ENCOUNTER — Encounter: Payer: Self-pay | Admitting: *Deleted

## 2014-02-28 ENCOUNTER — Other Ambulatory Visit: Payer: Self-pay | Admitting: *Deleted

## 2014-02-28 DIAGNOSIS — I739 Peripheral vascular disease, unspecified: Secondary | ICD-10-CM

## 2014-02-28 MED ORDER — CLOPIDOGREL BISULFATE 75 MG PO TABS: 75.0000 mg | ORAL_TABLET | Freq: Every day | ORAL | Status: AC

## 2014-09-14 ENCOUNTER — Other Ambulatory Visit: Payer: Self-pay

## 2014-09-14 ENCOUNTER — Emergency Department (HOSPITAL_COMMUNITY): Payer: 59

## 2014-09-14 ENCOUNTER — Encounter (HOSPITAL_COMMUNITY): Payer: Self-pay | Admitting: Emergency Medicine

## 2014-09-14 ENCOUNTER — Emergency Department (HOSPITAL_COMMUNITY)
Admission: EM | Admit: 2014-09-14 | Discharge: 2014-09-15 | Discharge status: Against Medical Advice | Payer: 59 | Attending: Emergency Medicine | Admitting: Emergency Medicine

## 2014-09-14 DIAGNOSIS — E669 Obesity, unspecified: Secondary | ICD-10-CM | POA: Diagnosis not present

## 2014-09-14 DIAGNOSIS — R0602 Shortness of breath: Secondary | ICD-10-CM | POA: Diagnosis not present

## 2014-09-14 DIAGNOSIS — R079 Chest pain, unspecified: Secondary | ICD-10-CM | POA: Diagnosis present

## 2014-09-14 DIAGNOSIS — I129 Hypertensive chronic kidney disease with stage 1 through stage 4 chronic kidney disease, or unspecified chronic kidney disease: Secondary | ICD-10-CM | POA: Diagnosis not present

## 2014-09-14 DIAGNOSIS — E1140 Type 2 diabetes mellitus with diabetic neuropathy, unspecified: Secondary | ICD-10-CM | POA: Diagnosis not present

## 2014-09-14 DIAGNOSIS — R2241 Localized swelling, mass and lump, right lower limb: Secondary | ICD-10-CM | POA: Diagnosis not present

## 2014-09-14 DIAGNOSIS — E1149 Type 2 diabetes mellitus with other diabetic neurological complication: Secondary | ICD-10-CM | POA: Diagnosis not present

## 2014-09-14 DIAGNOSIS — E1129 Type 2 diabetes mellitus with other diabetic kidney complication: Secondary | ICD-10-CM | POA: Diagnosis not present

## 2014-09-14 DIAGNOSIS — N183 Chronic kidney disease, stage 3 (moderate): Secondary | ICD-10-CM | POA: Diagnosis not present

## 2014-09-14 LAB — COMPREHENSIVE METABOLIC PANEL
ALT: 12 U/L (ref 0–53)
AST: 15 U/L (ref 0–37)
Albumin: 3.6 g/dL (ref 3.5–5.2)
Alkaline Phosphatase: 44 U/L (ref 39–117)
Anion gap: 12 (ref 5–15)
BILIRUBIN TOTAL: 0.3 mg/dL (ref 0.3–1.2)
BUN: 79 mg/dL — ABNORMAL HIGH (ref 6–23)
CALCIUM: 8.3 mg/dL — AB (ref 8.4–10.5)
CHLORIDE: 103 mmol/L (ref 96–112)
CO2: 22 mmol/L (ref 19–32)
Creatinine, Ser: 4.42 mg/dL — ABNORMAL HIGH (ref 0.50–1.35)
GFR, EST AFRICAN AMERICAN: 14 mL/min — AB (ref >90)
GFR, EST NON AFRICAN AMERICAN: 12 mL/min — AB (ref >90)
Glucose, Bld: 270 mg/dL — ABNORMAL HIGH (ref 70–99)
POTASSIUM: 4.4 mmol/L (ref 3.5–5.1)
Sodium: 137 mmol/L (ref 135–145)
TOTAL PROTEIN: 6.8 g/dL (ref 6.0–8.3)

## 2014-09-14 LAB — CBC WITH DIFFERENTIAL/PLATELET
BASOS PCT: 0 % (ref 0–1)
Basophils Absolute: 0 10*3/uL (ref 0.0–0.1)
Eosinophils Absolute: 0.5 10*3/uL (ref 0.0–0.7)
Eosinophils Relative: 7 % — ABNORMAL HIGH (ref 0–5)
HEMATOCRIT: 28.1 % — AB (ref 39.0–52.0)
Hemoglobin: 9.1 g/dL — ABNORMAL LOW (ref 13.0–17.0)
LYMPHS ABS: 1.1 10*3/uL (ref 0.7–4.0)
LYMPHS PCT: 16 % (ref 12–46)
MCH: 29.1 pg (ref 26.0–34.0)
MCHC: 32.4 g/dL (ref 30.0–36.0)
MCV: 89.8 fL (ref 78.0–100.0)
MONO ABS: 0.5 10*3/uL (ref 0.1–1.0)
Monocytes Relative: 7 % (ref 3–12)
Neutro Abs: 4.9 10*3/uL (ref 1.7–7.7)
Neutrophils Relative %: 70 % (ref 43–77)
Platelets: 245 10*3/uL (ref 150–400)
RBC: 3.13 MIL/uL — ABNORMAL LOW (ref 4.22–5.81)
RDW: 15.1 % (ref 11.5–15.5)
WBC: 7 10*3/uL (ref 4.0–10.5)

## 2014-09-14 LAB — I-STAT TROPONIN, ED: Troponin i, poc: 0.01 ng/mL (ref 0.00–0.08)

## 2014-09-14 NOTE — ED Notes (Signed)
Pt reports central chest pain today that subsided after a short period of time- admits to shortness of breath, lungs sounds clear.  Pt also admits to right foot redness and swelling since Thursday- hx of cellulitis- PCP started pt on Keflex on Monday.  Pt speaking in full sentences at present, no distress noted.

## 2014-09-14 NOTE — ED Notes (Signed)
During rounding, pt is unable to be found in waiting room.

## 2014-09-22 ENCOUNTER — Other Ambulatory Visit: Payer: Self-pay | Admitting: Internal Medicine

## 2014-09-22 DIAGNOSIS — L03115 Cellulitis of right lower limb: Secondary | ICD-10-CM

## 2014-09-26 ENCOUNTER — Ambulatory Visit
Admission: RE | Admit: 2014-09-26 | Discharge: 2014-09-26 | Disposition: A | Discharge status: Home / Self Care | Payer: Medicare Other | Source: Ambulatory Visit | Attending: Internal Medicine | Admitting: Internal Medicine

## 2014-09-26 DIAGNOSIS — L03115 Cellulitis of right lower limb: Secondary | ICD-10-CM

## 2014-10-17 ENCOUNTER — Other Ambulatory Visit (HOSPITAL_COMMUNITY): Payer: Self-pay | Admitting: *Deleted

## 2014-10-18 ENCOUNTER — Encounter (HOSPITAL_COMMUNITY)
Admission: RE | Admit: 2014-10-18 | Discharge: 2014-10-18 | Disposition: A | Discharge status: Home / Self Care | Payer: Medicare Other | Source: Ambulatory Visit | Attending: Nephrology | Admitting: Nephrology

## 2014-10-18 DIAGNOSIS — D509 Iron deficiency anemia, unspecified: Secondary | ICD-10-CM | POA: Diagnosis not present

## 2014-10-18 MED ORDER — SODIUM CHLORIDE 0.9 % IV SOLN
510.0000 mg | INTRAVENOUS | Status: DC
  Administered 2014-10-18: 510 mg via INTRAVENOUS
  Filled 2014-10-18: qty 17

## 2014-10-25 ENCOUNTER — Encounter (HOSPITAL_COMMUNITY)
Admission: RE | Admit: 2014-10-25 | Discharge: 2014-10-25 | Disposition: A | Discharge status: Home / Self Care | Payer: Medicare Other | Source: Ambulatory Visit | Attending: Nephrology | Admitting: Nephrology

## 2014-10-25 DIAGNOSIS — D509 Iron deficiency anemia, unspecified: Secondary | ICD-10-CM | POA: Diagnosis not present

## 2014-10-25 MED ORDER — SODIUM CHLORIDE 0.9 % IV SOLN
510.0000 mg | INTRAVENOUS | Status: AC
  Administered 2014-10-25: 09:00:00 510 mg via INTRAVENOUS
  Filled 2014-10-25: qty 17

## 2014-11-14 ENCOUNTER — Other Ambulatory Visit: Payer: Self-pay | Admitting: Internal Medicine

## 2014-11-14 DIAGNOSIS — I6529 Occlusion and stenosis of unspecified carotid artery: Secondary | ICD-10-CM

## 2014-11-15 ENCOUNTER — Ambulatory Visit
Admission: RE | Admit: 2014-11-15 | Discharge: 2014-11-15 | Disposition: A | Discharge status: Home / Self Care | Payer: Medicare Other | Source: Ambulatory Visit | Attending: Internal Medicine | Admitting: Internal Medicine

## 2014-11-15 DIAGNOSIS — I6529 Occlusion and stenosis of unspecified carotid artery: Secondary | ICD-10-CM

## 2014-12-01 ENCOUNTER — Encounter: Payer: Self-pay | Admitting: Vascular Surgery

## 2014-12-01 IMAGING — CT CT HEAD W/O CM
2 series · 16 of 30 positions shown, 20 images · non-contrast
Comparison: None.

CLINICAL DATA: Disequilibrium, carotid disease, evaluate for
stroke.

CT HEAD WITHOUT CONTRAST
TECHNIQUE: Contiguous axial images were obtained from the base of
the skull through the vertex without contrast.

[Series 3: head bone · axial · 0.49mm/px · z∈[+4,+45]mm · 3 of 28 slices shown]
[im 2/28  bone]
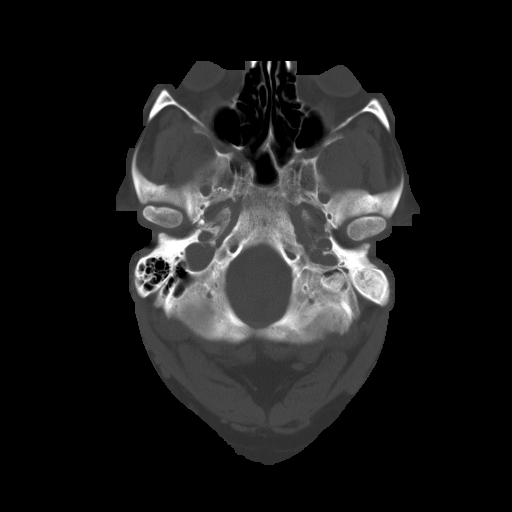
[im 6/28  bone]
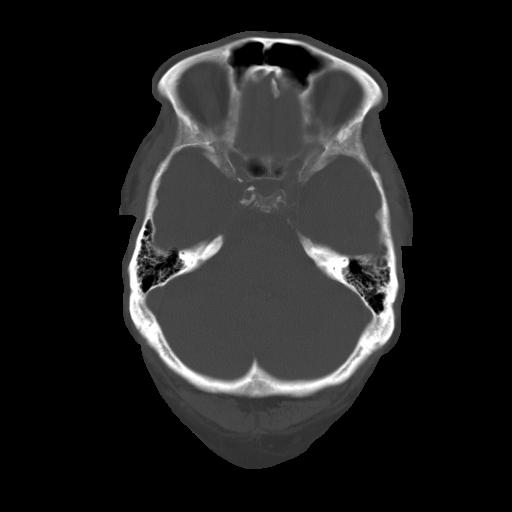
[im 10/28  bone]
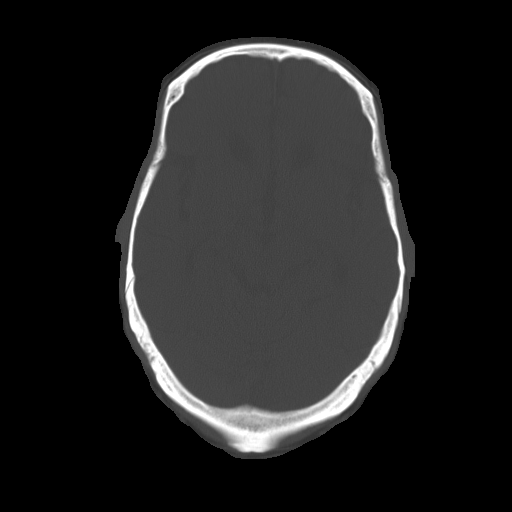

[Series 32: 3d filtered head w/o · axial · non-contrast · 0.49mm/px · z∈[+4,+128]mm · 13 of 28 slices shown, 17 images]
[im 2/28  brain]
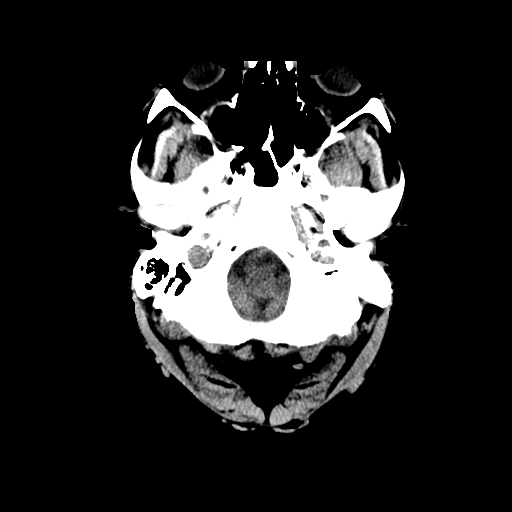
[im 2/28  bone]
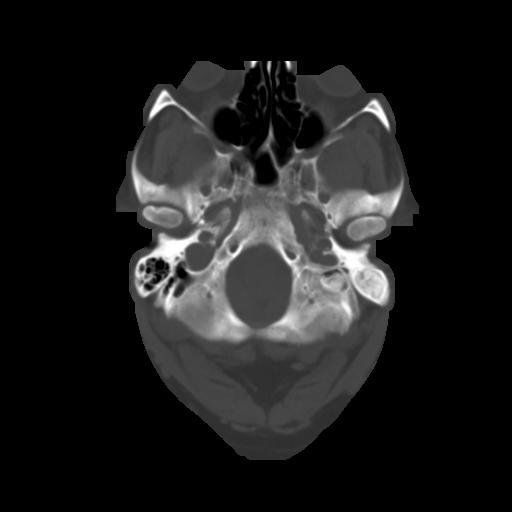
[im 4/28  brain]
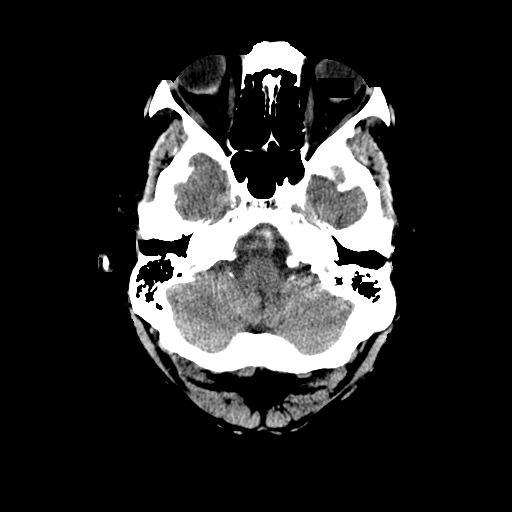
[im 6/28  brain]
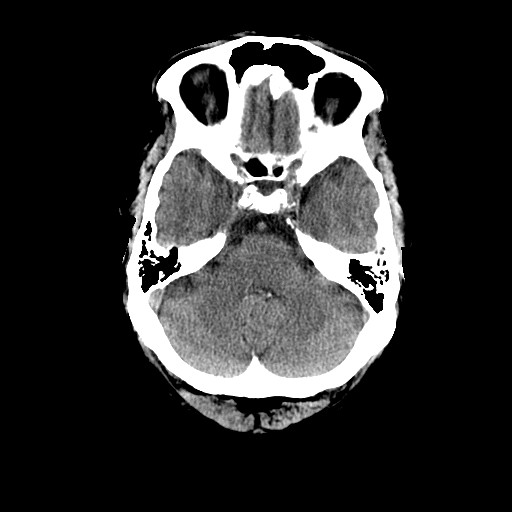
[im 8/28  brain]
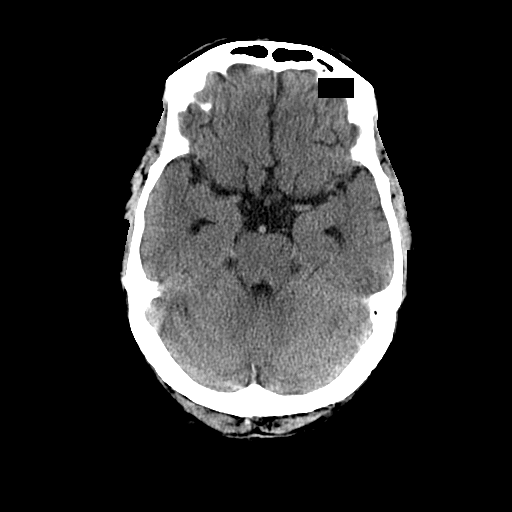
[im 10/28  brain]
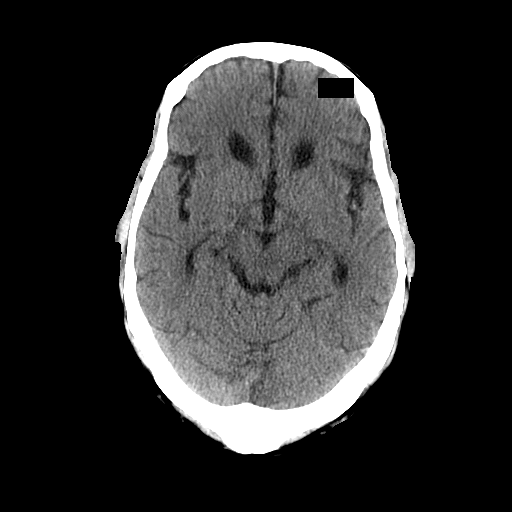
[im 10/28  bone]
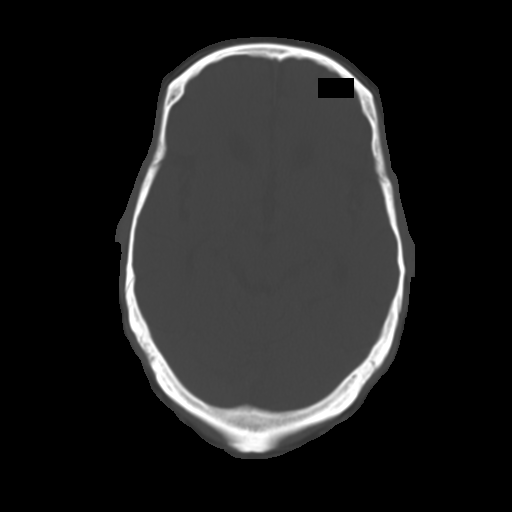
[im 12/28  brain]
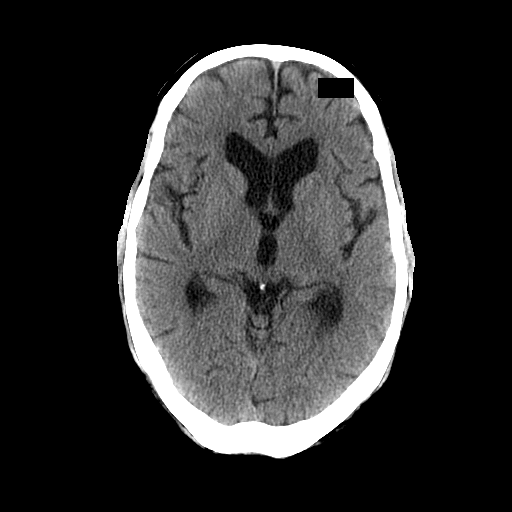
[im 14/28  brain]
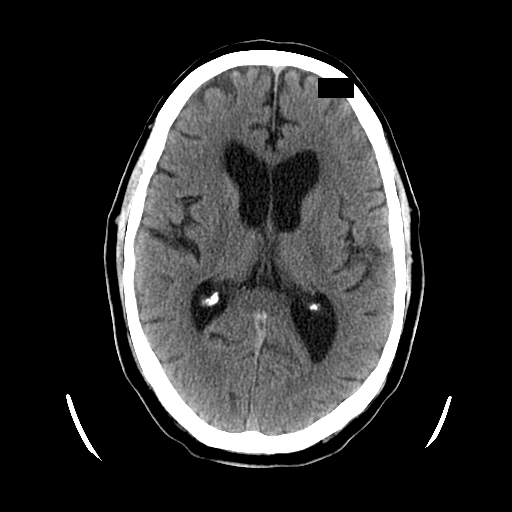
[im 16/28  brain]
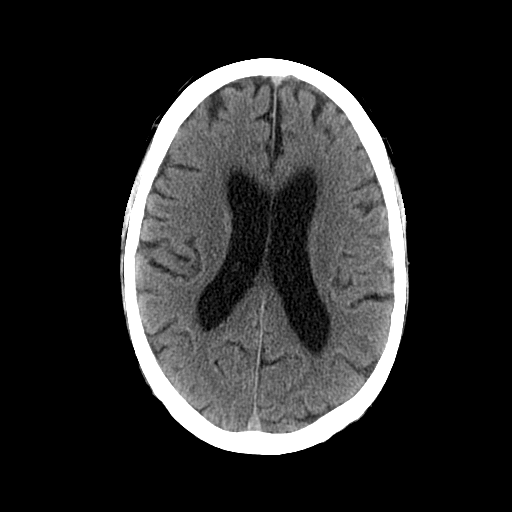
[im 18/28  brain]
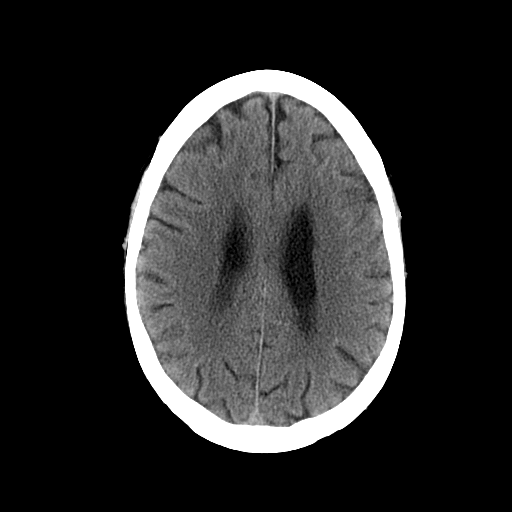
[im 18/28  bone]
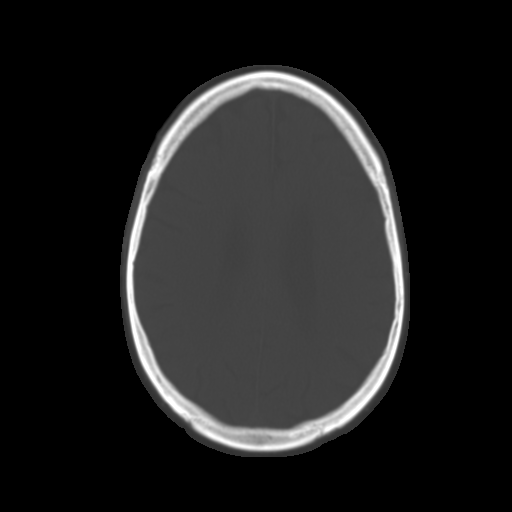
[im 20/28  brain]
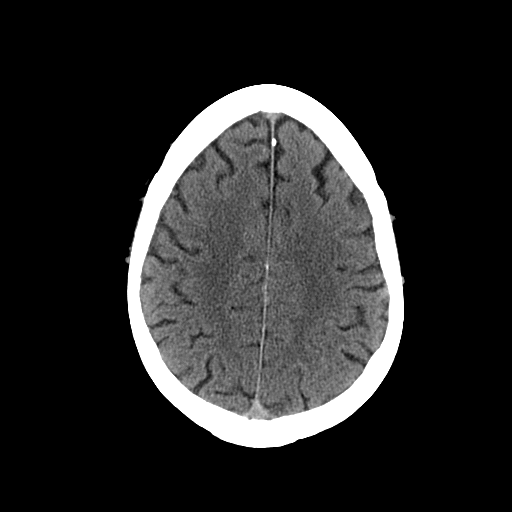
[im 22/28  brain]
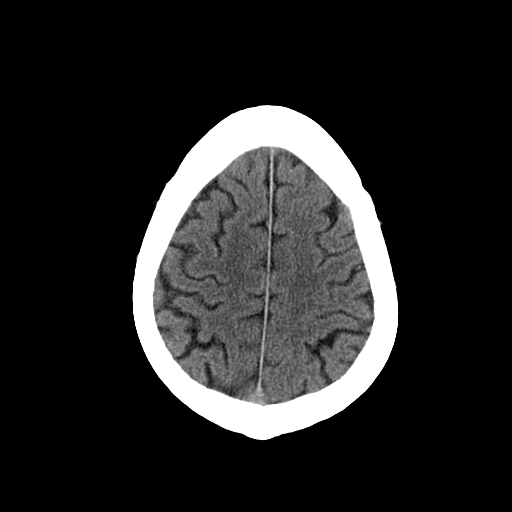
[im 24/28  brain]
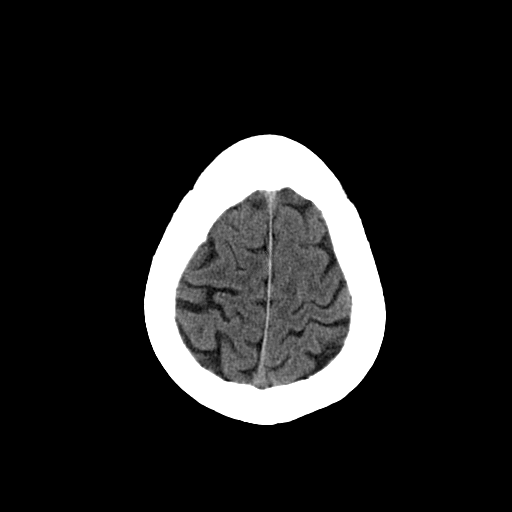
[im 26/28  brain]
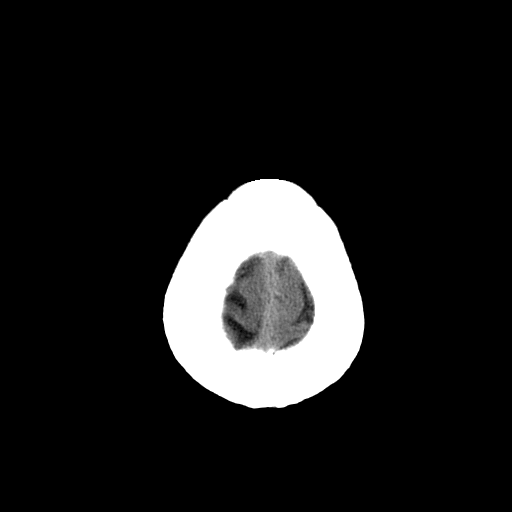
[im 26/28  bone]
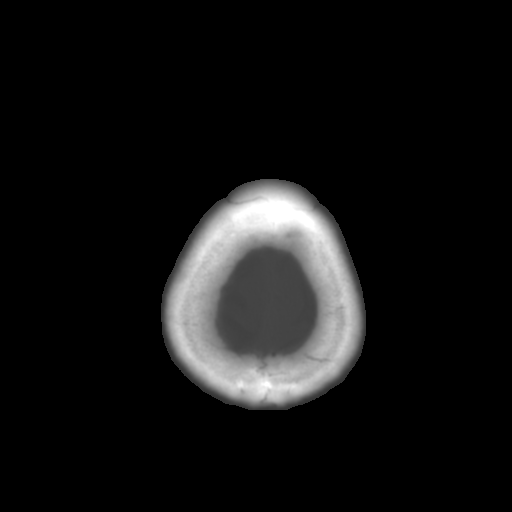

[16 of 30 positions shown; findings below may reference images not displayed]

FINDINGS: There is no evidence for acute infarction, intracranial
hemorrhage, mass lesion, hydrocephalus, or extra-axial fluid.  Mild
age related atrophy and chronic microvascular ischemic change.  No
CT signs of proximal vascular thrombosis.  No posterior circulation
infarct is evident.  Moderate vascular calcification affects the
carotid siphons.

Calvarium is intact.  Negative orbits, sinuses, and mastoids.
IMPRESSION: Chronic changes as described.  No visible acute infarction or
hemorrhage.  No CT signs of proximal vascular thrombosis.

## 2014-12-02 ENCOUNTER — Encounter: Payer: Self-pay | Admitting: Vascular Surgery

## 2014-12-02 ENCOUNTER — Ambulatory Visit (INDEPENDENT_AMBULATORY_CARE_PROVIDER_SITE_OTHER): Payer: Medicare Other | Admitting: Vascular Surgery

## 2014-12-02 VITALS — BP 135/61 | HR 70 | Ht 71.0 in | Wt 247.0 lb

## 2014-12-02 DIAGNOSIS — I6523 Occlusion and stenosis of bilateral carotid arteries: Secondary | ICD-10-CM | POA: Diagnosis not present

## 2014-12-02 NOTE — Progress Notes (Signed)
    Established Carotid Patient  History of Present Illness  Kyle Herman is a 79 y.o. (08/24/1935) male who presents with chief complaint: "don't know why I'm here."  Pt has known history of possible high grade stenosis in L ICA with R ICA stenosis <50%.  The patient was CKD stage V previously.  He needed a CTA or carotid angiogram to further evaluate the L ICA, but he declined due to his kidney disease.  At this point, his CKD has progressed further and is essentially ESRD reportedly.  He is has decided to NOT proceed with HD.  The patient's PMH, PSH, SH, FamHx, Med, and Allergies are unchanged from 03/05/13.  On ROS today: +leg swelling, denies AMS or pruritus  Physical Examination  Filed Vitals:   12/02/14 1121 12/02/14 1123 12/02/14 1129  BP: 139/59 155/54 135/61  Pulse: 72 73 70  Height: 5\' 11"  (1.803 m)    Weight: 247 lb (112.038 kg)    SpO2: 97%     Body mass index is 34.46 kg/(m^2).  General: A&O x 3, WD, Obese,   Eyes: PERRLA, EOMI, Post surg chg to pupils,   Pulmonary: Sym exp, good air movt, CTAB, no rales, rhonchi, & wheezing  Cardiac: RRR, Nl S1, S2, no Murmurs, rubs or gallops  Gastrointestinal: soft, NTND, -G/R, - HSM, - masses, - CVAT B, large pannus, unable to palpate aorta  Musculoskeletal: M/S 5/5 throughout , Extremities without ischemic changes, BLE edema 1-2+, chronic venous stasis skin changes   Neurologic: CN 2-12 intact , Pain and light touch intact in extremities except decreased sensation in feet, Motor exam as listed above  Medical Decision Making  Kyle Herman is a 79 y.o. male who presents with: asx likely high grade L CCA and ICA stenoses, low grade R ICA, CKD stage V   Pt refused B carotid duplex and follow-up  As he is also refusing HD, I suspect follow up on the carotid disease is irrelevant as his lifespan will be determined by his imminent ESRD  Available as needed.  Leonides SakeBrian , MD [facility]  [place] Office: [phone] Pager: [phone]  12/02/2014, 1:14 PM

## 2015-04-26 ENCOUNTER — Other Ambulatory Visit (HOSPITAL_COMMUNITY): Payer: Self-pay | Admitting: *Deleted

## 2015-04-27 ENCOUNTER — Encounter (HOSPITAL_COMMUNITY): Payer: Medicare Other

## 2015-05-05 ENCOUNTER — Encounter (HOSPITAL_COMMUNITY)
Admission: RE | Admit: 2015-05-05 | Discharge: 2015-05-05 | Disposition: A | Discharge status: Home / Self Care | Payer: Medicare Other | Source: Ambulatory Visit | Attending: Nephrology | Admitting: Nephrology

## 2015-05-05 DIAGNOSIS — D509 Iron deficiency anemia, unspecified: Secondary | ICD-10-CM | POA: Insufficient documentation

## 2015-05-05 MED ORDER — SODIUM CHLORIDE 0.9 % IV SOLN
510.0000 mg | INTRAVENOUS | Status: DC
  Administered 2015-05-05: 510 mg via INTRAVENOUS
  Filled 2015-05-05: qty 17

## 2015-05-05 NOTE — Discharge Instructions (Signed)
Ferumoxytol injection What is this medicine? FERUMOXYTOL is an iron complex. Iron is used to make healthy red blood cells, which carry oxygen and nutrients throughout the body. This medicine is used to treat iron deficiency anemia in people with chronic kidney disease. This medicine may be used for other purposes; ask your health care provider or pharmacist if you have questions. COMMON BRAND NAME(S): Feraheme What should I tell my health care provider before I take this medicine? They need to know if you have any of these conditions: -anemia not caused by low iron levels -high levels of iron in the blood -magnetic resonance imaging (MRI) test scheduled -an unusual or allergic reaction to iron, other medicines, foods, dyes, or preservatives -pregnant or trying to get pregnant -breast-feeding How should I use this medicine? This medicine is for injection into a vein. It is given by a health care professional in a hospital or clinic setting. Talk to your pediatrician regarding the use of this medicine in children. Special care may be needed. Overdosage: If you think you've taken too much of this medicine contact a poison control center or emergency room at once. Overdosage: If you think you have taken too much of this medicine contact a poison control center or emergency room at once. NOTE: This medicine is only for you. Do not share this medicine with others. What if I miss a dose? It is important not to miss your dose. Call your doctor or health care professional if you are unable to keep an appointment. What may interact with this medicine? This medicine may interact with the following medications: -other iron products This list may not describe all possible interactions. Give your health care provider a list of all the medicines, herbs, non-prescription drugs, or dietary supplements you use. Also tell them if you smoke, drink alcohol, or use illegal drugs. Some items may interact with your  medicine. What should I watch for while using this medicine? Visit your doctor or healthcare professional regularly. Tell your doctor or healthcare professional if your symptoms do not start to get better or if they get worse. You may need blood work done while you are taking this medicine. You may need to follow a special diet. Talk to your doctor. Foods that contain iron include: whole grains/cereals, dried fruits, beans, or peas, leafy green vegetables, and organ meats (liver, kidney). What side effects may I notice from receiving this medicine? Side effects that you should report to your doctor or health care professional as soon as possible: -allergic reactions like skin rash, itching or hives, swelling of the face, lips, or tongue -breathing problems -changes in blood pressure -feeling faint or lightheaded, falls -fever or chills -flushing, sweating, or hot feelings -swelling of the ankles or feet Side effects that usually do not require medical attention (Report these to your doctor or health care professional if they continue or are bothersome.): -diarrhea -headache -nausea, vomiting -stomach pain This list may not describe all possible side effects. Call your doctor for medical advice about side effects. You may report side effects to FDA at 1-800-FDA-1088. Where should I keep my medicine? This drug is given in a hospital or clinic and will not be stored at home. NOTE: This sheet is a summary. It may not cover all possible information. If you have questions about this medicine, talk to your doctor, pharmacist, or health care provider.  2015, Elsevier/Gold Standard. (2012-03-20 15:23:36) Ferumoxytol injection What is this medicine? FERUMOXYTOL is an iron complex. Iron is used  to make healthy red blood cells, which carry oxygen and nutrients throughout the body. This medicine is used to treat iron deficiency anemia in people with chronic kidney disease. This medicine may be used for  other purposes; ask your health care provider or pharmacist if you have questions. COMMON BRAND NAME(S): Feraheme What should I tell my health care provider before I take this medicine? They need to know if you have any of these conditions: -anemia not caused by low iron levels -high levels of iron in the blood -magnetic resonance imaging (MRI) test scheduled -an unusual or allergic reaction to iron, other medicines, foods, dyes, or preservatives -pregnant or trying to get pregnant -breast-feeding How should I use this medicine? This medicine is for injection into a vein. It is given by a health care professional in a hospital or clinic setting. Talk to your pediatrician regarding the use of this medicine in children. Special care may be needed. Overdosage: If you think you've taken too much of this medicine contact a poison control center or emergency room at once. Overdosage: If you think you have taken too much of this medicine contact a poison control center or emergency room at once. NOTE: This medicine is only for you. Do not share this medicine with others. What if I miss a dose? It is important not to miss your dose. Call your doctor or health care professional if you are unable to keep an appointment. What may interact with this medicine? This medicine may interact with the following medications: -other iron products This list may not describe all possible interactions. Give your health care provider a list of all the medicines, herbs, non-prescription drugs, or dietary supplements you use. Also tell them if you smoke, drink alcohol, or use illegal drugs. Some items may interact with your medicine. What should I watch for while using this medicine? Visit your doctor or healthcare professional regularly. Tell your doctor or healthcare professional if your symptoms do not start to get better or if they get worse. You may need blood work done while you are taking this medicine. You may  need to follow a special diet. Talk to your doctor. Foods that contain iron include: whole grains/cereals, dried fruits, beans, or peas, leafy green vegetables, and organ meats (liver, kidney). What side effects may I notice from receiving this medicine? Side effects that you should report to your doctor or health care professional as soon as possible: -allergic reactions like skin rash, itching or hives, swelling of the face, lips, or tongue -breathing problems -changes in blood pressure -feeling faint or lightheaded, falls -fever or chills -flushing, sweating, or hot feelings -swelling of the ankles or feet Side effects that usually do not require medical attention (Report these to your doctor or health care professional if they continue or are bothersome.): -diarrhea -headache -nausea, vomiting -stomach pain This list may not describe all possible side effects. Call your doctor for medical advice about side effects. You may report side effects to FDA at 1-800-FDA-1088. Where should I keep my medicine? This drug is given in a hospital or clinic and will not be stored at home. NOTE: This sheet is a summary. It may not cover all possible information. If you have questions about this medicine, talk to your doctor, pharmacist, or health care provider.  2015, Elsevier/Gold Standard. (2012-03-20 15:23:36)

## 2015-05-11 ENCOUNTER — Encounter (HOSPITAL_COMMUNITY)
Admission: RE | Admit: 2015-05-11 | Discharge: 2015-05-11 | Disposition: A | Discharge status: Home / Self Care | Payer: Medicare Other | Source: Ambulatory Visit | Attending: Nephrology | Admitting: Nephrology

## 2015-05-11 DIAGNOSIS — D509 Iron deficiency anemia, unspecified: Secondary | ICD-10-CM | POA: Diagnosis not present

## 2015-05-11 MED ORDER — SODIUM CHLORIDE 0.9 % IV SOLN
510.0000 mg | INTRAVENOUS | Status: AC
  Administered 2015-05-11: 510 mg via INTRAVENOUS
  Filled 2015-05-11: qty 17

## 2015-05-12 DIAGNOSIS — D509 Iron deficiency anemia, unspecified: Secondary | ICD-10-CM | POA: Diagnosis not present

## 2015-06-22 ENCOUNTER — Emergency Department (HOSPITAL_COMMUNITY): Payer: Medicare Other

## 2015-06-22 ENCOUNTER — Encounter (HOSPITAL_COMMUNITY): Payer: Self-pay | Admitting: Emergency Medicine

## 2015-06-22 ENCOUNTER — Emergency Department (HOSPITAL_COMMUNITY)
Admission: EM | Admit: 2015-06-22 | Discharge: 2015-06-22 | Disposition: A | Discharge status: Home / Self Care | Payer: Medicare Other | Source: Intra-hospital | Attending: Emergency Medicine | Admitting: Emergency Medicine

## 2015-06-22 DIAGNOSIS — R112 Nausea with vomiting, unspecified: Secondary | ICD-10-CM | POA: Diagnosis not present

## 2015-06-22 DIAGNOSIS — E119 Type 2 diabetes mellitus without complications: Secondary | ICD-10-CM | POA: Insufficient documentation

## 2015-06-22 DIAGNOSIS — N19 Unspecified kidney failure: Secondary | ICD-10-CM | POA: Diagnosis not present

## 2015-06-22 DIAGNOSIS — Z515 Encounter for palliative care: Secondary | ICD-10-CM

## 2015-06-22 DIAGNOSIS — E669 Obesity, unspecified: Secondary | ICD-10-CM | POA: Diagnosis not present

## 2015-06-22 DIAGNOSIS — W19XXXA Unspecified fall, initial encounter: Secondary | ICD-10-CM

## 2015-06-22 DIAGNOSIS — N186 End stage renal disease: Secondary | ICD-10-CM

## 2015-06-22 DIAGNOSIS — E782 Mixed hyperlipidemia: Secondary | ICD-10-CM | POA: Diagnosis not present

## 2015-06-22 DIAGNOSIS — Z992 Dependence on renal dialysis: Secondary | ICD-10-CM | POA: Diagnosis not present

## 2015-06-22 DIAGNOSIS — K219 Gastro-esophageal reflux disease without esophagitis: Secondary | ICD-10-CM | POA: Diagnosis not present

## 2015-06-22 DIAGNOSIS — Z794 Long term (current) use of insulin: Secondary | ICD-10-CM | POA: Diagnosis not present

## 2015-06-22 DIAGNOSIS — R531 Weakness: Secondary | ICD-10-CM

## 2015-06-22 DIAGNOSIS — Z79899 Other long term (current) drug therapy: Secondary | ICD-10-CM | POA: Diagnosis not present

## 2015-06-22 DIAGNOSIS — I120 Hypertensive chronic kidney disease with stage 5 chronic kidney disease or end stage renal disease: Secondary | ICD-10-CM | POA: Insufficient documentation

## 2015-06-22 DIAGNOSIS — Z7982 Long term (current) use of aspirin: Secondary | ICD-10-CM | POA: Insufficient documentation

## 2015-06-22 DIAGNOSIS — Z880 Allergy status to penicillin: Secondary | ICD-10-CM | POA: Diagnosis not present

## 2015-06-22 DIAGNOSIS — Z7902 Long term (current) use of antithrombotics/antiplatelets: Secondary | ICD-10-CM | POA: Insufficient documentation

## 2015-06-22 DIAGNOSIS — Z87891 Personal history of nicotine dependence: Secondary | ICD-10-CM | POA: Insufficient documentation

## 2015-06-22 DIAGNOSIS — R4182 Altered mental status, unspecified: Secondary | ICD-10-CM | POA: Diagnosis present

## 2015-06-22 LAB — COMPREHENSIVE METABOLIC PANEL
ALT: 11 U/L — ABNORMAL LOW (ref 17–63)
ANION GAP: 16 — AB (ref 5–15)
AST: 20 U/L (ref 15–41)
Albumin: 3.7 g/dL (ref 3.5–5.0)
Alkaline Phosphatase: 31 U/L — ABNORMAL LOW (ref 38–126)
BILIRUBIN TOTAL: 0.5 mg/dL (ref 0.3–1.2)
BUN: 102 mg/dL — ABNORMAL HIGH (ref 6–20)
CHLORIDE: 106 mmol/L (ref 101–111)
CO2: 16 mmol/L — ABNORMAL LOW (ref 22–32)
Calcium: 8.7 mg/dL — ABNORMAL LOW (ref 8.9–10.3)
Creatinine, Ser: 6.01 mg/dL — ABNORMAL HIGH (ref 0.61–1.24)
GFR, EST AFRICAN AMERICAN: 9 mL/min — AB (ref >60)
GFR, EST NON AFRICAN AMERICAN: 8 mL/min — AB (ref >60)
Glucose, Bld: 231 mg/dL — ABNORMAL HIGH (ref 65–99)
POTASSIUM: 3.7 mmol/L (ref 3.5–5.1)
Sodium: 138 mmol/L (ref 135–145)
TOTAL PROTEIN: 7.3 g/dL (ref 6.5–8.1)

## 2015-06-22 LAB — URINALYSIS, ROUTINE W REFLEX MICROSCOPIC
BILIRUBIN URINE: NEGATIVE
Glucose, UA: 250 mg/dL — AB
KETONES UR: NEGATIVE mg/dL
LEUKOCYTES UA: NEGATIVE
NITRITE: NEGATIVE
PH: 5 (ref 5.0–8.0)
Protein, ur: 100 mg/dL — AB
Specific Gravity, Urine: 1.015 (ref 1.005–1.030)
Urobilinogen, UA: 0.2 mg/dL (ref 0.0–1.0)

## 2015-06-22 LAB — I-STAT VENOUS BLOOD GAS, ED
Acid-base deficit: 5 mmol/L — ABNORMAL HIGH (ref 0.0–2.0)
Bicarbonate: 19.2 mEq/L — ABNORMAL LOW (ref 20.0–24.0)
O2 SAT: 81 %
PCO2 VEN: 31.7 mmHg — AB (ref 45.0–50.0)
TCO2: 20 mmol/L (ref 0–100)
pH, Ven: 7.389 — ABNORMAL HIGH (ref 7.250–7.300)
pO2, Ven: 45 mmHg (ref 30.0–45.0)

## 2015-06-22 LAB — CBC
HCT: 25.9 % — ABNORMAL LOW (ref 39.0–52.0)
Hemoglobin: 8.6 g/dL — ABNORMAL LOW (ref 13.0–17.0)
MCH: 29.8 pg (ref 26.0–34.0)
MCHC: 33.2 g/dL (ref 30.0–36.0)
MCV: 89.6 fL (ref 78.0–100.0)
PLATELETS: 203 10*3/uL (ref 150–400)
RBC: 2.89 MIL/uL — AB (ref 4.22–5.81)
RDW: 17.1 % — AB (ref 11.5–15.5)
WBC: 20.9 10*3/uL — ABNORMAL HIGH (ref 4.0–10.5)

## 2015-06-22 LAB — URINE MICROSCOPIC-ADD ON

## 2015-06-22 LAB — I-STAT CHEM 8, ED
BUN: 105 mg/dL — AB (ref 6–20)
CHLORIDE: 105 mmol/L (ref 101–111)
Calcium, Ion: 1.06 mmol/L — ABNORMAL LOW (ref 1.13–1.30)
Creatinine, Ser: 6 mg/dL — ABNORMAL HIGH (ref 0.61–1.24)
Glucose, Bld: 223 mg/dL — ABNORMAL HIGH (ref 65–99)
HEMATOCRIT: 27 % — AB (ref 39.0–52.0)
Hemoglobin: 9.2 g/dL — ABNORMAL LOW (ref 13.0–17.0)
Potassium: 3.8 mmol/L (ref 3.5–5.1)
SODIUM: 139 mmol/L (ref 135–145)
TCO2: 15 mmol/L (ref 0–100)

## 2015-06-22 LAB — DIFFERENTIAL
BASOS PCT: 0 %
Basophils Absolute: 0 10*3/uL (ref 0.0–0.1)
EOS ABS: 0 10*3/uL (ref 0.0–0.7)
EOS PCT: 0 %
LYMPHS ABS: 1.3 10*3/uL (ref 0.7–4.0)
Lymphocytes Relative: 6 %
MONO ABS: 0.6 10*3/uL (ref 0.1–1.0)
Monocytes Relative: 3 %
NEUTROS ABS: 19 10*3/uL — AB (ref 1.7–7.7)
NEUTROS PCT: 91 %
WBC MORPHOLOGY: INCREASED

## 2015-06-22 LAB — RAPID URINE DRUG SCREEN, HOSP PERFORMED
Amphetamines: NOT DETECTED
BARBITURATES: NOT DETECTED
BENZODIAZEPINES: NOT DETECTED
COCAINE: NOT DETECTED
OPIATES: NOT DETECTED
Tetrahydrocannabinol: NOT DETECTED

## 2015-06-22 LAB — PROTIME-INR
INR: 1.22 (ref 0.00–1.49)
PROTHROMBIN TIME: 15.5 s — AB (ref 11.6–15.2)

## 2015-06-22 LAB — APTT: aPTT: 30 seconds (ref 24–37)

## 2015-06-22 LAB — I-STAT CG4 LACTIC ACID, ED
Lactic Acid, Venous: 1.25 mmol/L (ref 0.5–2.0)
Lactic Acid, Venous: 1.51 mmol/L (ref 0.5–2.0)

## 2015-06-22 LAB — ETHANOL

## 2015-06-22 LAB — I-STAT TROPONIN, ED: TROPONIN I, POC: 0.03 ng/mL (ref 0.00–0.08)

## 2015-06-22 MED ORDER — LORAZEPAM 1 MG PO TABS: 1.0000 mg | ORAL_TABLET | Freq: Three times a day (TID) | ORAL | Status: AC | PRN

## 2015-06-22 MED ORDER — OXYCODONE HCL 5 MG PO TABS: 5.0000 mg | ORAL_TABLET | ORAL | Status: AC | PRN

## 2015-06-22 MED ORDER — ONDANSETRON 4 MG PO TBDP: 4.0000 mg | ORAL_TABLET | Freq: Three times a day (TID) | ORAL | Status: AC | PRN

## 2015-06-22 MED ORDER — ONDANSETRON HCL 4 MG/2ML IJ SOLN
4.0000 mg | Freq: Once | INTRAMUSCULAR | Status: AC
  Administered 2015-06-22: 4 mg via INTRAVENOUS
  Filled 2015-06-22: qty 2

## 2015-06-22 NOTE — ED Notes (Signed)
Pt going to MRI

## 2015-06-22 NOTE — Code Documentation (Signed)
79yo [facility] [id number].  EMS reports that the patient talked to his son at 0900 this morning on the phone and patient was at his baseline other than "sounding tired".  Patient's son called him back three times starting at 1015 with no answer.  Patient finally answered the phone and his son noticed that his speech was slurred and the patient reported that he could not get up off of the porch.  EMS called and activated a code stroke for AMS, slurred speech and right arm drift.  EMS also reports altered gait and agitation and that patient was initially uncooperative with transport but eventually convinced by his son.  Stroke team at the bedside on arrival.  Labs drawn and patient cleared by Dr. Dalene SeltzerSchlossman.  Patient to CT with ED RN and stroke team.  Patient has been incontinent with foul-smelling urine on pants.  NIHSS 5, see documentation for details and code stroke times.  Patient reports that he had bilateral twitching and fell but did not lose consciousness.  He reports that he has had similar episodes before.  He also reports that he regularly has episodes of incontinence.  Dr. Amada JupiterKirkpatrick at the bedside.  No acute stroke treatment at this time.  Patient's son to the bedside who reports that patient has had recent decline with ability to take care of himself and incontinence.  Dr. Amada JupiterKirkpatrick to the bedside to discuss plan of care with patient's family.  Code stroke canceled at 1255.

## 2015-06-22 NOTE — Consult Note (Signed)
[facility] Medical Consultation  REX OESTERLE BJY:[id number] DOB: Dec 29, 1935 DOA: 06/22/2015 PCP: Pearla Dubonnet, MD   Requesting physician: Dr Dalene Seltzer Date of consultation: 06/22/15 Reason for consultation: ESRD, AMS  Impression/Recommendations Active Problems:   ESRD (end stage renal disease) (HCC)   Uremia   Generalized weakness   End of life care   End-stage renal disease: Patient refusing dialysis. Worsening renal function noted on laboratory results. Patient continues to make urine. Recent presenting in a uremic state. Per discussion with family patient has been refusing dialysis with a clear understanding that his kidneys are failing.   Altered mental state: Likely from uremia. Waxing and waning as at time of my evaluation at 1700 patient was completely clear from a mental standpoint and has total capacity. Patient meets criteria for emergent dialysis however patient actively refusing and has made his wishes very clear to family members in the past that he does not want to be in the hospital and does not want dialysis ever. Discussed how patient's renal function will likely continue to deteriorate and patient may never come back to normal state prior to his death. There is a chance that his renal function may pick back up again and he will again receive normal mental function. Workup negative for other etiology such as stroke, infectious.  End of life planning: Discussed patient's care with palliative care as well as hospice. Greatly appreciate hospice evaluation for this patient as he likely has less than 6 months to live. At this point in time the patient is desiring to go home with a very clear understanding that this will likely lead to his eventual demise. I personally would recommend sending the patient home with when necessary narcotics such as oxycodone and/or Ativan for comfort and agitation until hospice is able to set up further medical care.  Labs and care plan  discussed at length with patient's 2 sons who are healthcare power of attorney as well as stepfather. All are in agreement with this plan.  Family aware that bilateral or unilateral weakness or arm jerking all part of worsening renal function and buildup of metabolic byproducts in his body.   Chief Complaint: Altered mental state and weakness  HPI:  Per report patient developed generalized weakness this morning and was called by his son multiple times before he answered at which time he was slurring his speech. Upon evaluation by family members had unilateral weakness. This pelvis constant and getting worse. Now patient has occasional limb jerking. Her stroke was called and patient was evaluated in the ED by the neurology team who feel that this is related to the patient's renal failure and not a stroke. Patient has seen nephrology in the past and refuses dialysis Prior to onset of symptoms patient was in his normal state of health living at home. There are no reported incidences of chest pain, shortness of breath, productive cough, fevers, abdominal pain, diarrhea, dysuria, back pain, rash.  Review of Systems:  Level 5 caveat. Patient presenting with altered mental status and unable to give further review systems outside of what is mentioned in the history of present illness.  Past Medical History  Diagnosis Date  . Type II or unspecified type diabetes mellitus with neurological manifestations, uncontrolled   . Diabetes mellitus   . Type II or unspecified type diabetes mellitus with renal manifestations, uncontrolled   . Chronic kidney disease, stage III (moderate)   . Polyneuropathy in diabetes(357.2)   . Obesity, unspecified   . Abscess of  buttock, left   . Hypertension   . Spinal stenosis   . Sciatica   . H/O onychomycosis   . Left carotid bruit   . Diverticulosis   . DJD (degenerative joint disease)   . Mixed hyperlipidemia   . Routine general medical examination at a health care  facility   . Edema   . Dermatophytosis of nail   . Lactose intolerance   . Diabetic neuropathy (HCC)   . Hyperlipemia   . Onychomycosis   . Kidney disease     stage 5  . Diverticulosis   . GERD (gastroesophageal reflux disease)   . Swelling     lower leg  . Cellulitis    Past Surgical History  Procedure Laterality Date  . Eye surgery  2011, 2012    Bilateral cataract removal  . Joint replacement  2002    left hip replaced  . Tonsillectomy  1959  . Total hip arthroplasty Left 2002  . Cosmetic surgery  06/1998    for a chain saw injury to left leg  . Cystoscopy    . Rib fracture surgery    . Cataract extraction      right & left   . Steriod injection     Social History:  reports that he quit smoking about 18 years ago. He quit smokeless tobacco use about 43 years ago. He reports that he does not drink alcohol or use illicit drugs.  Allergies  Allergen Reactions  . Diclofenac Sodium Rash    Face only  . Lactase Other (See Comments)    GI upset  . Lopid [Gemfibrozil] Other (See Comments)    Neuropathy left thigh  . Zetia [Ezetimibe] Other (See Comments)    Myalgias  . Influenza Vaccines     Flu like sx every dose  . Penicillins     Patient unsure of reaction. Happened many years ago.  . Metformin And Related Diarrhea  . Simvastatin     Myalgias   Family History  Problem Relation Age of Onset  . Diabetes Sister   . Hypertension Sister   . Cancer Sister   . Hyperlipidemia Sister   . Peripheral vascular disease Sister     amputation  . Diabetes Brother   . Cancer Brother   . Hypertension Brother   . Hyperlipidemia Brother   . Diabetes Mother   . Hypertension Mother   . Heart attack Mother   . CVA Mother   . Cancer Mother   . Diabetes Father   . Hypertension Father   . Heart attack Father     Prior to Admission medications   Medication Sig Start Date End Date Taking? Authorizing Provider  aspirin 81 MG tablet Take 81 mg by mouth daily.   Yes  Historical Provider, MD  calcium acetate (PHOSLO) 667 MG capsule Take by mouth 3 (three) times daily with meals.   Yes Historical Provider, MD  carvedilol (COREG) 25 MG tablet Take 25 mg by mouth 2 (two) times daily with a meal.   Yes Historical Provider, MD  Cholecalciferol (VITAMIN D) 1000 UNITS capsule Take 1,000 Units by mouth daily.   Yes Historical Provider, MD  clopidogrel (PLAVIX) 75 MG tablet Take 1 tablet (75 mg total) by mouth daily. 02/28/14  Yes Fransisco Hertz, MD  doxazosin (CARDURA) 8 MG tablet  11/05/14  Yes Historical Provider, MD  furosemide (LASIX) 40 MG tablet Take 80 mg by mouth 2 (two) times daily. 1 & 1/2 tab daily. 02/15/13  Yes Historical Provider, MD  HUMULIN 70/30 KWIKPEN (70-30) 100 UNIT/ML PEN  11/26/14  Yes Historical Provider, MD  hydrALAZINE (APRESOLINE) 50 MG tablet 50 mg 3 (three) times daily.  11/12/14  Yes Historical Provider, MD  mupirocin ointment (BACTROBAN) 2 %  10/17/14  Yes Historical Provider, MD  Omega-3 Fatty Acids (FISH OIL) 1000 MG CAPS Take 1 capsule by mouth at bedtime.    Yes Historical Provider, MD  potassium chloride (K-DUR) 10 MEQ tablet Take 10 mEq by mouth daily.   Yes Historical Provider, MD  rosuvastatin (CRESTOR) 10 MG tablet Take by mouth daily. 1/4 tab daily   Yes Historical Provider, MD  Tamsulosin HCl (FLOMAX) 0.4 MG CAPS Take 0.4 mg by mouth daily.   Yes Historical Provider, MD  vitamin B-12 (CYANOCOBALAMIN) 1000 MCG tablet Take 1,000 mcg by mouth daily.   Yes Historical Provider, MD  cephALEXin (KEFLEX) 500 MG capsule Take 1 capsule (500 mg total) by mouth 4 (four) times daily. Patient not taking: Reported on 12/02/2014 06/03/13   Elpidio Anis, PA-C  cyclobenzaprine (FLEXERIL) 10 MG tablet Take 10 mg by mouth 3 (three) times daily as needed for muscle spasms.    Historical Provider, MD  HYDROcodone-acetaminophen (NORCO/VICODIN) 5-325 MG per tablet Take 1 tablet by mouth every 4 (four) hours as needed for pain. Patient not taking: Reported on  12/02/2014 06/03/13   Elpidio Anis, PA-C  insulin NPH-regular (HUMULIN 70/30) (70-30) 100 UNIT/ML injection Inject 36 Units into the skin 3 (three) times daily.     Historical Provider, MD  neomycin-polymyxin-hydrocortisone (CORTISPORIN) 3.5-10000-1 otic suspension Place 2 drops into both ears as needed.    Historical Provider, MD   Physical Exam: Blood pressure 132/56, pulse 92, resp. rate 20, height  (1.803 m), weight 108.863 kg (240 lb), SpO2 97 %. Filed Vitals:   06/22/15 1659  BP: 132/56  Pulse: 92  Resp: 20      General:  NAD, calm  Eyes: PERRLA, EOMI  ENT: Dry mucous membranes  Neck: Soft, full range of motion, no thyromegaly  Cardiovascular: No lower extremity edema, less than 2 second cap refill  Respiratory: No respiratory distress,  Abdomen: Nondistended  Skin: Dry, no rash, no skin tears  Musculoskeletal: Grossly normal strength and upper and lower extremity venous bilaterally. No effusions  Psychiatric: Alert and oriented 3, answers all questions appropriately, normal speech.  Neurologic: Cranial nerves II through XII grossly intact, moves all extremities and coordinated fashion  Labs on Admission:  Basic Metabolic Panel:  Recent Labs Lab 06/22/15 1226 06/22/15 1235  NA 138 139  K 3.7 3.8  CL 106 105  CO2 16*  --   GLUCOSE 231* 223*  BUN 102* 105*  CREATININE 6.01* 6.00*  CALCIUM 8.7*  --    Liver Function Tests:  Recent Labs Lab 06/22/15 1226  AST 20  ALT 11*  ALKPHOS 31*  BILITOT 0.5  PROT 7.3  ALBUMIN 3.7   No results for input(s): LIPASE, AMYLASE in the last 168 hours. No results for input(s): AMMONIA in the last 168 hours. CBC:  Recent Labs Lab 06/22/15 1226 06/22/15 1235  WBC 20.9*  --   NEUTROABS 19.0*  --   HGB 8.6* 9.2*  HCT 25.9* 27.0*  MCV 89.6  --   PLT 203  --    Cardiac Enzymes: No results for input(s): CKTOTAL, CKMB, CKMBINDEX, TROPONINI in the last 168 hours. BNP: Invalid input(s): POCBNP CBG: No  results for input(s): GLUCAP in the last 168 hours.  Radiological Exams on Admission: Dg Chest 2 View  06/22/2015  CLINICAL DATA:  Altered mental status and leukocytosis EXAM: CHEST  2 VIEW COMPARISON:  September 14, 2014 FINDINGS: There is no edema or consolidation. Heart is upper normal in size with pulmonary vascularity within normal limits. No appreciable adenopathy. No bone lesions. There is calcification in the left carotid artery. IMPRESSION: No edema or consolidation. There is left carotid artery calcification. Electronically Signed   By: Bretta BangWilliam  Woodruff III M.D.   On: 06/22/2015 14:58   Ct Head Wo Contrast  06/22/2015  CLINICAL DATA:  79 year old male with acute confusion, altered mental status and right-sided weakness. Code stroke. EXAM: CT HEAD WITHOUT CONTRAST TECHNIQUE: Contiguous axial images were obtained from the base of the skull through the vertex without intravenous contrast. COMPARISON:  12/16/2012 head CT FINDINGS: Mild atrophy and chronic small-vessel white matter ischemic changes again identified. No acute intracranial abnormalities are identified, including mass lesion or mass effect, hydrocephalus, extra-axial fluid collection, midline shift, hemorrhage, or acute infarction. Right maxillary sinus mucosal thickening is identified. The visualized bony calvarium is unremarkable. IMPRESSION: No evidence of acute intracranial abnormality. Atrophy and chronic small-vessel white matter ischemic changes. Chronic right maxillary sinus disease/sinusitis. Critical Value/emergent results were called by telephone at the time of interpretation on 06/22/2015 at 12:44 pm to Dr. Amada JupiterKirkpatrick, who verbally acknowledged these results. Electronically Signed   By: Harmon PierJeffrey  Hu M.D.   On: 06/22/2015 12:45   Mr Brain Ltd W/o Cm  06/22/2015  CLINICAL DATA:  79 y.o. male with history of stage 5 kidney disease needing dialysis but patient has refused. Patient brought to ED as code stroke due to AMS and  intermittent jerking. BILATERAL leg weakness with incontinence. On arrival symptoms improved, CT head WNL. Due to preserved mental status with bilateral jerking, seizure unlikely. Given elevated BUN likely AMS and myoclonus in the setting of Uremia. Stroke Risk Factors - diabetes mellitus, hyperlipidemia and hypertension EXAM: MRI HEAD WITHOUT CONTRAST TECHNIQUE: Multiplanar, multiecho pulse sequences of the brain and surrounding structures were obtained without intravenous contrast. COMPARISON:  CT head 06/22/2015. FINDINGS: Only diffusion imaging was obtained. The patient refused further scanning according to technologist notes. No restricted diffusion is evident.  Generalized atrophy is noted. IMPRESSION: No evidence for acute cerebral infarction. Electronically Signed   By: Elsie StainJohn T Curnes M.D.   On: 06/22/2015 16:46      Time spent: >70  Min in direct pt care and counseling.   MERRELL, DAVID J [facility]   If 7PM-7AM, please contact night-coverage [email or web address] Password [email or web address] 06/22/2015, 5:12 PM

## 2015-06-22 NOTE — ED Notes (Signed)
To ED via [facility] with c/o stroke symptoms. Last seen normal last evening by son, son called pt this am and states that he sounded tired. Then son tried to call back and pt did not answer phone 3 times. Then pt called son back and told him he was on back porch and was unable to get up, states was having twitching in right leg, and speech was slurred. On arrival, pt is alert, oriented x 3, w/d, speech slightly slurred, strong odor of old urine on clothing. Old vomitus and food stains on t-shirt. Equal grips, no facial drooping,

## 2015-06-22 NOTE — ED Notes (Signed)
Pt returned from MRI °

## 2015-06-22 NOTE — ED Notes (Signed)
Pt in MRI.

## 2015-06-22 NOTE — ED Notes (Signed)
assisted Clydie BraunKaren, RN with adjusting patient up on stretcher; visitors stepped out

## 2015-06-22 NOTE — ED Provider Notes (Signed)
CSN: [id number]     Arrival date & time 06/22/15  1225 History   None    Chief Complaint  Patient presents with  . Altered Mental Status    An emergency department physician performed an initial assessment on this suspected stroke patient at 1225 (Kaydince Towles). (Consider location/radiation/quality/duration/timing/severity/associated sxs/prior Treatment) HPI Comments: Reports woke feeling nausea and fatigue, "wore himself out" sitting outside and trying to open patio door and then both legs felt shaky and he layed down on his right side and couldn't get back up.  Right sided weakness on EMS evaluation and code stroke called.   Patient is a 79 y.o. male presenting with altered mental status.  Altered Mental Status Presenting symptoms: confusion   Severity:  Moderate Most recent episode:  Today Episode history:  Multiple Timing:  Intermittent Progression:  Waxing and waning Chronicity:  New Context comment:  CKD refusing dialysis, uremia Associated symptoms: nausea, vomiting and weakness   Associated symptoms: no abdominal pain, no fever, no headaches and no rash     Past Medical History  Diagnosis Date  . Type II or unspecified type diabetes mellitus with neurological manifestations, uncontrolled   . Diabetes mellitus   . Type II or unspecified type diabetes mellitus with renal manifestations, uncontrolled   . Chronic kidney disease, stage III (moderate)   . Polyneuropathy in diabetes(357.2)   . Obesity, unspecified   . Abscess of buttock, left   . Hypertension   . Spinal stenosis   . Sciatica   . H/O onychomycosis   . Left carotid bruit   . Diverticulosis   . DJD (degenerative joint disease)   . Mixed hyperlipidemia   . Routine general medical examination at a health care facility   . Edema   . Dermatophytosis of nail   . Lactose intolerance   . Diabetic neuropathy (HCC)   . Hyperlipemia   . Onychomycosis   . Kidney disease     stage 5  . Diverticulosis   . GERD  (gastroesophageal reflux disease)   . Swelling     lower leg  . Cellulitis    Past Surgical History  Procedure Laterality Date  . Eye surgery  2011, 2012    Bilateral cataract removal  . Joint replacement  2002    left hip replaced  . Tonsillectomy  1959  . Total hip arthroplasty Left 2002  . Cosmetic surgery  06/1998    for a chain saw injury to left leg  . Cystoscopy    . Rib fracture surgery    . Cataract extraction      right & left   . Steriod injection     Family History  Problem Relation Age of Onset  . Diabetes Sister   . Hypertension Sister   . Cancer Sister   . Hyperlipidemia Sister   . Peripheral vascular disease Sister     amputation  . Diabetes Brother   . Cancer Brother   . Hypertension Brother   . Hyperlipidemia Brother   . Diabetes Mother   . Hypertension Mother   . Heart attack Mother   . CVA Mother   . Cancer Mother   . Diabetes Father   . Hypertension Father   . Heart attack Father    Social History  Substance Use Topics  . Smoking status: Former Smoker    Quit date: 10/16/1996  . Smokeless tobacco: Former NeurosurgeonUser    Quit date: 10/17/1971  . Alcohol Use: No    Review of  Systems  Constitutional: Positive for activity change and fatigue. Negative for fever.  HENT: Negative for sore throat.   Eyes: Negative for visual disturbance.  Respiratory: Negative for shortness of breath.   Cardiovascular: Negative for chest pain.  Gastrointestinal: Positive for nausea and vomiting. Negative for abdominal pain.  Genitourinary: Negative for difficulty urinating.  Musculoskeletal: Negative for back pain and neck stiffness.  Skin: Negative for rash.  Neurological: Positive for tremors and weakness. Negative for dizziness, syncope, facial asymmetry, speech difficulty, numbness and headaches.  Psychiatric/Behavioral: Positive for confusion.      Allergies  Diclofenac sodium; Lactase; Lopid; Zetia; Influenza vaccines; Penicillins; Metformin and related;  and Simvastatin  Home Medications   Prior to Admission medications   Medication Sig Start Date End Date Taking? Authorizing Provider  aspirin 81 MG tablet Take 81 mg by mouth daily.   Yes Historical Provider, MD  calcium acetate (PHOSLO) 667 MG capsule Take by mouth 3 (three) times daily with meals.   Yes Historical Provider, MD  carvedilol (COREG) 25 MG tablet Take 25 mg by mouth 2 (two) times daily with a meal.   Yes Historical Provider, MD  Cholecalciferol (VITAMIN D) 1000 UNITS capsule Take 1,000 Units by mouth daily.   Yes Historical Provider, MD  clopidogrel (PLAVIX) 75 MG tablet Take 1 tablet (75 mg total) by mouth daily. 02/28/14  Yes Fransisco Hertz, MD  doxazosin (CARDURA) 8 MG tablet  11/05/14  Yes Historical Provider, MD  furosemide (LASIX) 40 MG tablet Take 80 mg by mouth 2 (two) times daily. 1 & 1/2 tab daily. 02/15/13  Yes Historical Provider, MD  HUMULIN 70/30 KWIKPEN (70-30) 100 UNIT/ML PEN  11/26/14  Yes Historical Provider, MD  hydrALAZINE (APRESOLINE) 50 MG tablet 50 mg 3 (three) times daily.  11/12/14  Yes Historical Provider, MD  mupirocin ointment (BACTROBAN) 2 %  10/17/14  Yes Historical Provider, MD  Omega-3 Fatty Acids (FISH OIL) 1000 MG CAPS Take 1 capsule by mouth at bedtime.    Yes Historical Provider, MD  potassium chloride (K-DUR) 10 MEQ tablet Take 10 mEq by mouth daily.   Yes Historical Provider, MD  rosuvastatin (CRESTOR) 10 MG tablet Take by mouth daily. 1/4 tab daily   Yes Historical Provider, MD  Tamsulosin HCl (FLOMAX) 0.4 MG CAPS Take 0.4 mg by mouth daily.   Yes Historical Provider, MD  vitamin B-12 (CYANOCOBALAMIN) 1000 MCG tablet Take 1,000 mcg by mouth daily.   Yes Historical Provider, MD  cephALEXin (KEFLEX) 500 MG capsule Take 1 capsule (500 mg total) by mouth 4 (four) times daily. Patient not taking: Reported on 12/02/2014 06/03/13   Elpidio Anis, PA-C  cyclobenzaprine (FLEXERIL) 10 MG tablet Take 10 mg by mouth 3 (three) times daily as needed for muscle  spasms.    Historical Provider, MD  HYDROcodone-acetaminophen (NORCO/VICODIN) 5-325 MG per tablet Take 1 tablet by mouth every 4 (four) hours as needed for pain. Patient not taking: Reported on 12/02/2014 06/03/13   Elpidio Anis, PA-C  insulin NPH-regular (HUMULIN 70/30) (70-30) 100 UNIT/ML injection Inject 36 Units into the skin 3 (three) times daily.     Historical Provider, MD  neomycin-polymyxin-hydrocortisone (CORTISPORIN) 3.5-10000-1 otic suspension Place 2 drops into both ears as needed.    Historical Provider, MD   BP 125/51 mmHg  Pulse 87  Resp 25  Ht 5\' 11"  (1.803 m)  Wt 240 lb (108.863 kg)  BMI 33.49 kg/m2  SpO2 97% Physical Exam  Constitutional: He is oriented to person, place, and time.  He appears well-developed and well-nourished. No distress.  HENT:  Head: Normocephalic and atraumatic.  Eyes: Conjunctivae and EOM are normal.  Neck: Normal range of motion.  Cardiovascular: Normal rate, regular rhythm, normal heart sounds and intact distal pulses.  Exam reveals no gallop and no friction rub.   No murmur heard. Pulmonary/Chest: Effort normal and breath sounds normal. No respiratory distress. He has no wheezes. He has no rales.  Abdominal: Soft. He exhibits no distension. There is no tenderness. There is no guarding.  Musculoskeletal: He exhibits no edema.  Neurological: He is alert and oriented to person, place, and time. No cranial nerve deficit or sensory deficit. GCS eye subscore is 4. GCS verbal subscore is 5. GCS motor subscore is 6.  Right pronator drift, right UE weakness, otherwise normal neurologic exam  Skin: Skin is warm and dry. He is not diaphoretic.  Nursing note and vitals reviewed.   ED Course  Procedures (including critical care time) Labs Review Labs Reviewed  PROTIME-INR - Abnormal; Notable for the following:    Prothrombin Time 15.5 (*)    All other components within normal limits  CBC - Abnormal; Notable for the following:    WBC 20.9 (*)    RBC  2.89 (*)    Hemoglobin 8.6 (*)    HCT 25.9 (*)    RDW 17.1 (*)    All other components within normal limits  DIFFERENTIAL - Abnormal; Notable for the following:    Neutro Abs 19.0 (*)    All other components within normal limits  COMPREHENSIVE METABOLIC PANEL - Abnormal; Notable for the following:    CO2 16 (*)    Glucose, Bld 231 (*)    BUN 102 (*)    Creatinine, Ser 6.01 (*)    Calcium 8.7 (*)    ALT 11 (*)    Alkaline Phosphatase 31 (*)    GFR calc non Af Amer 8 (*)    GFR calc Af Amer 9 (*)    Anion gap 16 (*)    All other components within normal limits  URINALYSIS, ROUTINE W REFLEX MICROSCOPIC (NOT AT [facility]) - Abnormal; Notable for the following:    Glucose, UA 250 (*)    Hgb urine dipstick TRACE (*)    Protein, ur 100 (*)    All other components within normal limits  URINE MICROSCOPIC-ADD ON - Abnormal; Notable for the following:    Bacteria, UA FEW (*)    All other components within normal limits  I-STAT CHEM 8, ED - Abnormal; Notable for the following:    BUN 105 (*)    Creatinine, Ser 6.00 (*)    Glucose, Bld 223 (*)    Calcium, Ion 1.06 (*)    Hemoglobin 9.2 (*)    HCT 27.0 (*)    All other components within normal limits  I-STAT VENOUS BLOOD GAS, ED - Abnormal; Notable for the following:    pH, Ven 7.389 (*)    pCO2, Ven 31.7 (*)    Bicarbonate 19.2 (*)    Acid-base deficit 5.0 (*)    All other components within normal limits  CULTURE, BLOOD (ROUTINE X 2)  CULTURE, BLOOD (ROUTINE X 2)  URINE CULTURE  ETHANOL  APTT  URINE RAPID DRUG SCREEN, HOSP PERFORMED  I-STAT TROPOININ, ED  I-STAT CG4 LACTIC ACID, ED  I-STAT CG4 LACTIC ACID, ED    Imaging Review Dg Chest 2 View  06/22/2015  CLINICAL DATA:  Altered mental status and leukocytosis EXAM: CHEST  2  VIEW COMPARISON:  September 14, 2014 FINDINGS: There is no edema or consolidation. Heart is upper normal in size with pulmonary vascularity within normal limits. No appreciable adenopathy. No bone lesions. There  is calcification in the left carotid artery. IMPRESSION: No edema or consolidation. There is left carotid artery calcification. Electronically Signed   By: Bretta Bang III M.D.   On: 06/22/2015 14:58   Ct Head Wo Contrast  06/22/2015  CLINICAL DATA:  79 year old male with acute confusion, altered mental status and right-sided weakness. Code stroke. EXAM: CT HEAD WITHOUT CONTRAST TECHNIQUE: Contiguous axial images were obtained from the base of the skull through the vertex without intravenous contrast. COMPARISON:  12/16/2012 head CT FINDINGS: Mild atrophy and chronic small-vessel white matter ischemic changes again identified. No acute intracranial abnormalities are identified, including mass lesion or mass effect, hydrocephalus, extra-axial fluid collection, midline shift, hemorrhage, or acute infarction. Right maxillary sinus mucosal thickening is identified. The visualized bony calvarium is unremarkable. IMPRESSION: No evidence of acute intracranial abnormality. Atrophy and chronic small-vessel white matter ischemic changes. Chronic right maxillary sinus disease/sinusitis. Critical Value/emergent results were called by telephone at the time of interpretation on 06/22/2015 at 12:44 pm to Dr. Amada Jupiter, who verbally acknowledged these results. Electronically Signed   By: Harmon Pier M.D.   On: 06/22/2015 12:45   I have personally reviewed and evaluated these images and lab results as part of my medical decision-making.   EKG Interpretation None      MDM   Final diagnoses:  None   79 year old male with a history of chronic kidney disease refusing dialysis, diabetes, hypertension, carotid artery disease presents as a code stroke.  Initial concern was for altered mental status, right-sided weakness, and slurred speech.  Neurology evaluated patient who no longer had a right-sided drift, and patient reported that he had laid down on his right side and that's why he was initially unable to move  for EMS and code stroke was canceled. His head CT showed no acute findings. On my evaluation patient again had right-sided weakness and MRI was performed which did not show any acute findings. Patient was noted to have a leukocytosis of 20,000, however was afebrile, no sign of urinary tract infection and no sign of pneumonia.  Patient has a history of chronic kidney disease, and has declined dialysis, and today symptoms are concerning for worsening uremia. Patient reports pruritus, nausea, vomiting, myoclonic jerks, and waxing and waning altered mental status. His bicarbonate is 16 with a BUN of 102.  Initially consulted hospitalist Dr. Margot Ables for admission, however he evaluated the patient and discussed results and the goals of care in more detail with family and patient.  While patient's mental status is waxing and waning at this time, family and patient agree that he does not want hospitalization nor dialysis, understanding the risk of death and would like to focus on comfort and palliative care.  Hospice was consulted and will see the patient tomorrow as an outpatient. Patient was provided oxycodone, Ativan, and Zofran for comfort.   Alvira Monday, MD 06/23/15 0800

## 2015-06-22 NOTE — Consult Note (Signed)
Referring Physician: ED MD    Chief Complaint: Code Stroke  HPI:                                                                                                                                         Kyle Herman is an 79 y.o. male with significant medical history and ESRD requiring HD but patient refuses. Patient uses a walker at baseline and has been progressively getting weak over the past 2-3 weeks.  HE has also had incontinence secondary to both diuretic and inability to get to bathroom on time.  Patient was on his porch today when he noted his legs started to shake and he could not hold himself up.  HE was incontinent due to inability to get up. Patient was awake and aware of surrounding the whole time. When son arrived he noted the left arm having intermittent jerking and son also states he has increased intermittent myoclonus at rest over the past few months.   Date last known well: Date: 06/22/2015 Time last known well: Time: 09:00 tPA Given: No: symptoms resolved    Past Medical History  Diagnosis Date  . Type II or unspecified type diabetes mellitus with neurological manifestations, uncontrolled   . Diabetes mellitus   . Type II or unspecified type diabetes mellitus with renal manifestations, uncontrolled   . Chronic kidney disease, stage III (moderate)   . Polyneuropathy in diabetes(357.2)   . Obesity, unspecified   . Abscess of buttock, left   . Hypertension   . Spinal stenosis   . Sciatica   . H/O onychomycosis   . Left carotid bruit   . Diverticulosis   . DJD (degenerative joint disease)   . Mixed hyperlipidemia   . Routine general medical examination at a health care facility   . Edema   . Dermatophytosis of nail   . Lactose intolerance   . Diabetic neuropathy   . Hyperlipemia   . Onychomycosis   . Kidney disease     stage 5  . Diverticulosis   . GERD (gastroesophageal reflux disease)   . Swelling     lower leg  . Cellulitis     Past Surgical History   Procedure Laterality Date  . Eye surgery  2011, 2012    Bilateral cataract removal  . Joint replacement  2002    left hip replaced  . Tonsillectomy  1959  . Total hip arthroplasty Left 2002  . Cosmetic surgery  06/1998    for a chain saw injury to left leg  . Cystoscopy    . Rib fracture surgery    . Cataract extraction      right & left   . Steriod injection      Family History  Problem Relation Age of Onset  . Diabetes Sister   . Hypertension Sister   . Cancer Sister   . Hyperlipidemia Sister   .  Peripheral vascular disease Sister     amputation  . Diabetes Brother   . Cancer Brother   . Hypertension Brother   . Hyperlipidemia Brother   . Diabetes Mother   . Hypertension Mother   . Heart attack Mother   . CVA Mother   . Cancer Mother   . Diabetes Father   . Hypertension Father   . Heart attack Father    Social History:  reports that he quit smoking about 18 years ago. He quit smokeless tobacco use about 43 years ago. He reports that he does not drink alcohol or use illicit drugs.  Allergies:  Allergies  Allergen Reactions  . Diclofenac Sodium Rash    Face only  . Lactase Other (See Comments)    GI upset  . Lopid [Gemfibrozil] Other (See Comments)    Neuropathy left thigh  . Zetia [Ezetimibe] Other (See Comments)    Myalgias  . Influenza Vaccines     Flu like sx every dose  . Penicillins     Patient unsure of reaction. Happened many years ago.  . Metformin And Related Diarrhea  . Simvastatin     Myalgias    Medications:                                                                                                                           No current facility-administered medications for this encounter.   Current Outpatient Prescriptions  Medication Sig Dispense Refill  . aspirin 81 MG tablet Take 81 mg by mouth daily.    . calcium acetate (PHOSLO) 667 MG capsule Take by mouth 3 (three) times daily with meals.    . carvedilol (COREG) 25 MG tablet  Take 25 mg by mouth 2 (two) times daily with a meal.    . cephALEXin (KEFLEX) 500 MG capsule Take 1 capsule (500 mg total) by mouth 4 (four) times daily. (Patient not taking: Reported on 12/02/2014) 28 capsule 0  . Cholecalciferol (VITAMIN D) 1000 UNITS capsule Take 1,000 Units by mouth daily.    . clopidogrel (PLAVIX) 75 MG tablet Take 1 tablet (75 mg total) by mouth daily. 30 tablet 6  . cyclobenzaprine (FLEXERIL) 10 MG tablet Take 10 mg by mouth 3 (three) times daily as needed for muscle spasms.    Marland Kitchen doxazosin (CARDURA) 4 MG tablet Take 8 mg by mouth.     . doxazosin (CARDURA) 8 MG tablet     . furosemide (LASIX) 40 MG tablet Take 60 mg by mouth daily. 1 & 1/2 tab daily.    Marland Kitchen HUMULIN 70/30 KWIKPEN (70-30) 100 UNIT/ML PEN     . hydrALAZINE (APRESOLINE) 25 MG tablet Take 50 mg by mouth 3 (three) times daily.     . hydrALAZINE (APRESOLINE) 50 MG tablet     . HYDROcodone-acetaminophen (NORCO/VICODIN) 5-325 MG per tablet Take 1 tablet by mouth every 4 (four) hours as needed for pain. (Patient not taking: Reported on 12/02/2014) 10 tablet 0  .  insulin NPH-regular (HUMULIN 70/30) (70-30) 100 UNIT/ML injection Inject 36 Units into the skin 3 (three) times daily.     . metoCLOPramide (REGLAN) 5 MG tablet Take 5 mg by mouth 3 (three) times daily.    . mupirocin ointment (BACTROBAN) 2 %     . neomycin-polymyxin-hydrocortisone (CORTISPORIN) 3.5-10000-1 otic suspension Place 2 drops into both ears as needed.    . Omega-3 Fatty Acids (FISH OIL) 1000 MG CAPS Take 1 capsule by mouth at bedtime.     . potassium chloride (K-DUR) 10 MEQ tablet Take 10 mEq by mouth daily.    . rifampin (RIFADIN) 300 MG capsule     . rosuvastatin (CRESTOR) 10 MG tablet Take by mouth daily. 1/4 tab daily    . Tamsulosin HCl (FLOMAX) 0.4 MG CAPS Take 0.4 mg by mouth daily.    . vitamin B-12 (CYANOCOBALAMIN) 1000 MCG tablet Take 1,000 mcg by mouth daily.       ROS:                                                                                                                                        History obtained from the patient  General ROS: negative for - chills, fatigue, fever, night sweats, weight gain or weight loss Psychological ROS: negative for - behavioral disorder, hallucinations, memory difficulties, mood swings or suicidal ideation Ophthalmic ROS: negative for - blurry vision, double vision, eye pain or loss of vision ENT ROS: negative for - epistaxis, nasal discharge, oral lesions, sore throat, tinnitus or vertigo Allergy and Immunology ROS: negative for - hives or itchy/watery eyes Hematological and Lymphatic ROS: negative for - bleeding problems, bruising or swollen lymph nodes Endocrine ROS: negative for - galactorrhea, hair pattern changes, polydipsia/polyuria or temperature intolerance Respiratory ROS: negative for - cough, hemoptysis, shortness of breath or wheezing Cardiovascular ROS: negative for - chest pain, dyspnea on exertion, edema or irregular heartbeat Gastrointestinal ROS: negative for - abdominal pain, diarrhea, hematemesis, nausea/vomiting or stool incontinence Genito-Urinary ROS: negative for - dysuria, hematuria, incontinence or urinary frequency/urgency Musculoskeletal ROS: negative for - joint swelling or muscular weakness Neurological ROS: as noted in HPI Dermatological ROS: negative for rash and skin lesion changes  Neurologic Examination:                                                                                                      Weight 109.2 kg (240 lb 11.9 oz), SpO2 98 %.  HEENT-  Normocephalic, no lesions, without  obvious abnormality.  Normal external eye and conjunctiva.  Normal TM's bilaterally.  Normal auditory canals and external ears. Normal external nose, mucus membranes and septum.  Normal pharynx. Cardiovascular- S1, S2 normal, pulses palpable throughout   Lungs- chest clear, no wheezing, rales, normal symmetric air entry Abdomen- normal findings: bowel sounds  normal Extremities- bilateral LE edema and hemosiderin staining.  Lymph-no adenopathy palpable Musculoskeletal-bilateral hip tenderness Skin-hyperpigmentation and edema  Neurological Examination Mental Status: Alert, oriented, thought content appropriate.  Speech fluent without evidence of aphasia.  Able to follow 3 step commands without difficulty. Cranial Nerves: II: Discs flat bilaterally; Visual fields grossly normal, pupils equal, round, reactive to light and accommodation III,IV, VI: ptosis not present, extra-ocular motions intact bilaterally V,VII: smile symmetric, facial light touch sensation normal bilaterally VIII: hearing normal bilaterally IX,X: uvula rises symmetrically XI: bilateral shoulder shrug XII: midline tongue extension Motor: Right : Upper extremity   5/5    Left:     Upper extremity   5/5  Lower extremity   5/5     Lower extremity   5/5 --right leg drift Tone and bulk:normal tone throughout; no atrophy noted Sensory: Pinprick and light touch intact throughout, bilaterally Deep Tendon Reflexes: 1+ and symmetric throughout UE no KJ or AJ Plantars: Patient would not allow [place] to test Cerebellar: Dysmetric bilateral finger-to-nose,  Gait: not tested      Lab Results: Basic Metabolic Panel:  Recent Labs Lab 06/22/15 1235  NA 139  K 3.8  CL 105  GLUCOSE 223*  BUN 105*  CREATININE 6.00*    Liver Function Tests: No results for input(s): AST, ALT, ALKPHOS, BILITOT, PROT, ALBUMIN in the last 168 hours. No results for input(s): LIPASE, AMYLASE in the last 168 hours. No results for input(s): AMMONIA in the last 168 hours.  CBC:  Recent Labs Lab 06/22/15 1235  HGB 9.2*  HCT 27.0*    Cardiac Enzymes: No results for input(s): CKTOTAL, CKMB, CKMBINDEX, TROPONINI in the last 168 hours.  Lipid Panel: No results for input(s): CHOL, TRIG, HDL, CHOLHDL, VLDL, LDLCALC in the last 168 hours.  CBG: No results for input(s): GLUCAP in the last 168  hours.  Microbiology: No results found for this or any previous visit.  Coagulation Studies: No results for input(s): LABPROT, INR in the last 72 hours.  Imaging: Ct Head Wo Contrast  06/22/2015  CLINICAL DATA:  79 year old male with acute confusion, altered mental status and right-sided weakness. Code stroke. EXAM: CT HEAD WITHOUT CONTRAST TECHNIQUE: Contiguous axial images were obtained from the base of the skull through the vertex without intravenous contrast. COMPARISON:  12/16/2012 head CT FINDINGS: Mild atrophy and chronic small-vessel white matter ischemic changes again identified. No acute intracranial abnormalities are identified, including mass lesion or mass effect, hydrocephalus, extra-axial fluid collection, midline shift, hemorrhage, or acute infarction. Right maxillary sinus mucosal thickening is identified. The visualized bony calvarium is unremarkable. IMPRESSION: No evidence of acute intracranial abnormality. Atrophy and chronic small-vessel white matter ischemic changes. Chronic right maxillary sinus disease/sinusitis. Critical Value/emergent results were called by telephone at the time of interpretation on 06/22/2015 at 12:44 pm to Dr. Amada Jupiter, who verbally acknowledged these results. Electronically Signed   By: Harmon Pier M.D.   On: 06/22/2015 12:45       Assessment and plan discussed with with attending physician and they are in agreement.    Felicie Morn PA-C [facility] [phone]  06/22/2015, 12:50 PM   Assessment: 79 y.o. male with history of stage 5 kidney  disease needing dialysis but patient has refused. Patient brought to ED as code stroke due to AMS and intermittent jerking. On arrival symptoms improved, CT head WNL and BUN 105.  Due to preserved mental status with bilateral jerking this would make seizure unlikely. Given elevated BUN likely AMS and myoclonus in the setting of Uremia. He was on his right side for 30 - 40 minutes prior to EMS  arrival which I think explains his difficulty initially with raising his right side and think TIA is unlikely.   Stroke Risk Factors - diabetes mellitus, hyperlipidemia and hypertension  Recommend: 1) Further metabolic workup per ER/IM 2) No further neurological recommendations 3) Neurology will S/O   Ritta Slot, MD [facility] [phone]  If 7pm- 7am, please page neurology on call as listed in [facility].

## 2015-06-23 LAB — URINE CULTURE

## 2015-06-27 LAB — CULTURE, BLOOD (ROUTINE X 2)
CULTURE: NO GROWTH
CULTURE: NO GROWTH

## 2015-07-20 DEATH — deceased

## 2017-06-06 IMAGING — CT CT HEAD W/O CM
1 series · 16 of 30 positions shown, 20 images · non-contrast
Comparison: 12/16/2012 head CT

CLINICAL DATA: 78-year-old male with acute confusion, altered
mental status and right-sided weakness. Code stroke.

EXAM:
CT HEAD WITHOUT CONTRAST
TECHNIQUE: Contiguous axial images were obtained from the base of the skull
through the vertex without intravenous contrast.

[Series 2: head 5.0 st · axial · 0.44mm/px · z∈[+1223,+1368]mm · 16 of 33 slices shown, 20 images]
[im 2/33  brain]
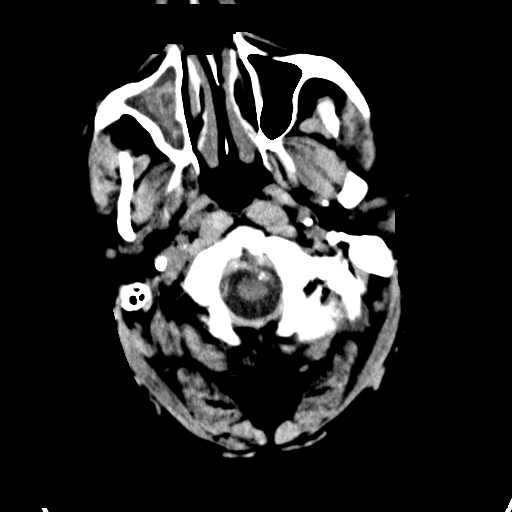
[im 2/33  bone]
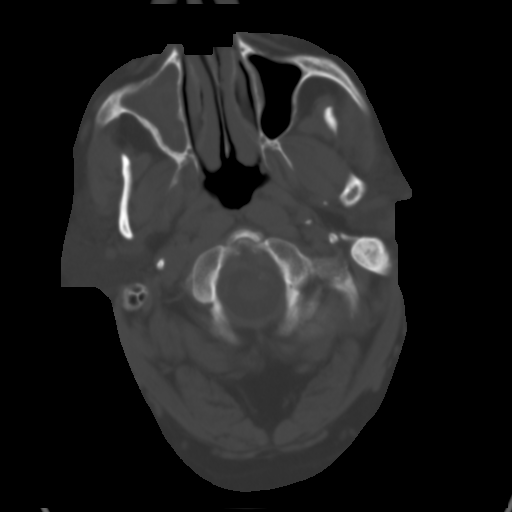
[im 4/33  brain]
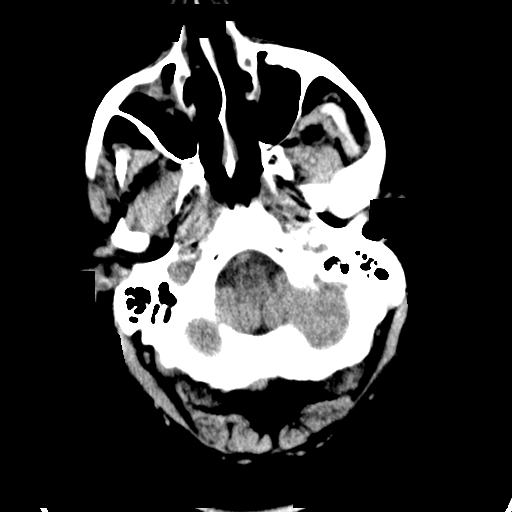
[im 6/33  brain]
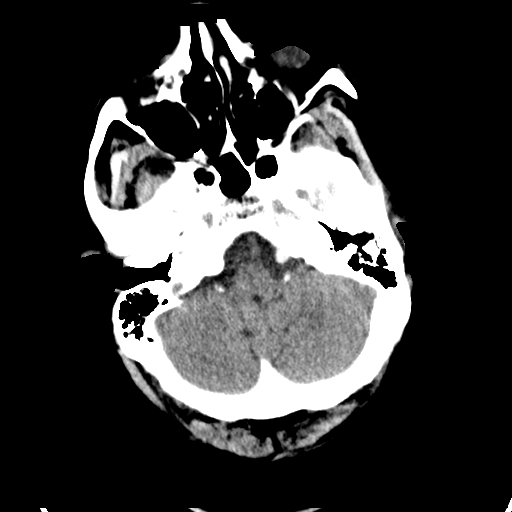
[im 8/33  brain]
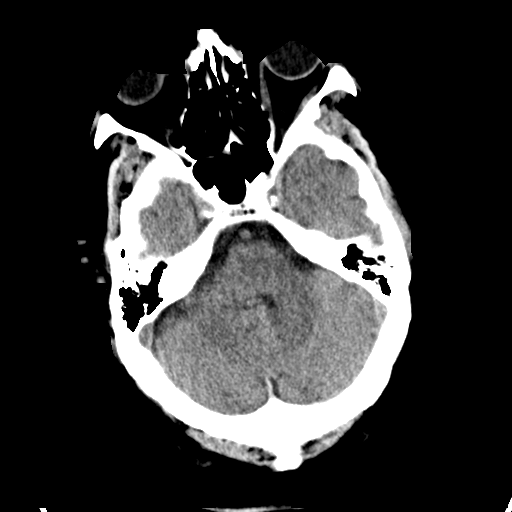
[im 9/33  brain]
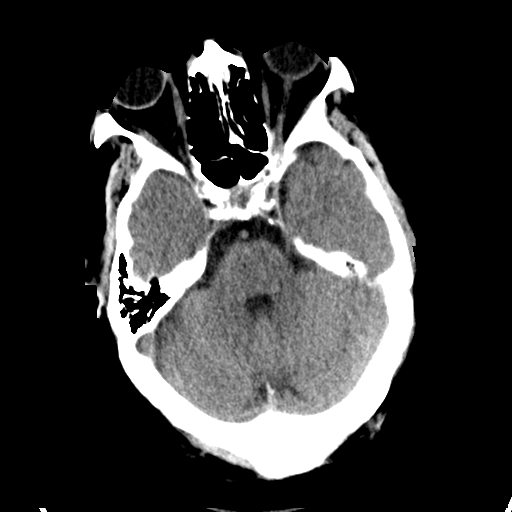
[im 9/33  bone]
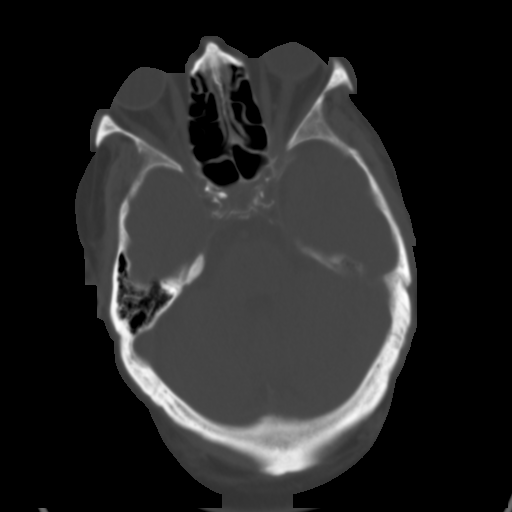
[im 12/33  brain]
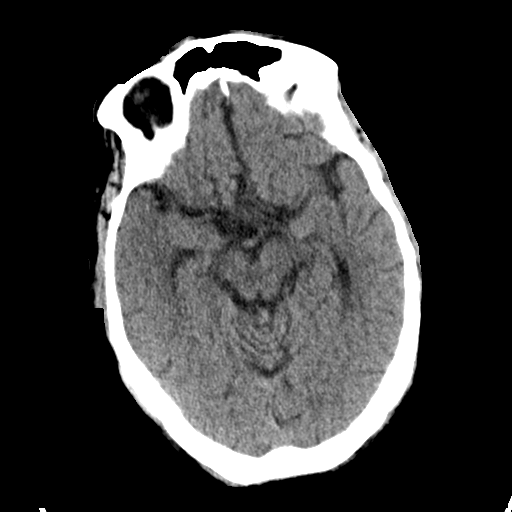
[im 14/33  brain]
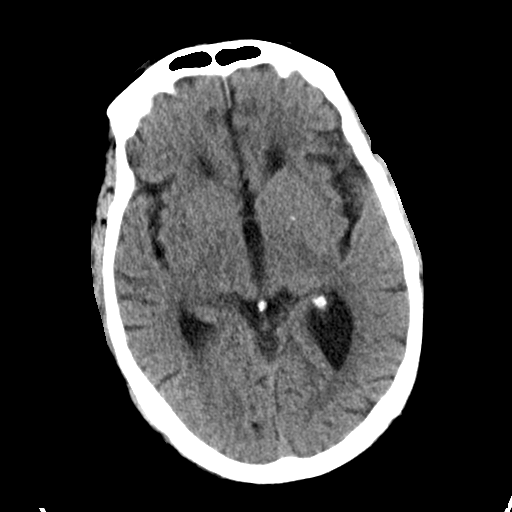
[im 16/33  brain]
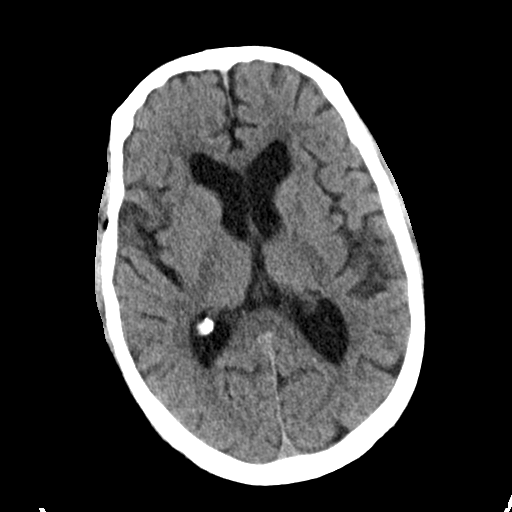
[im 17/33  brain]
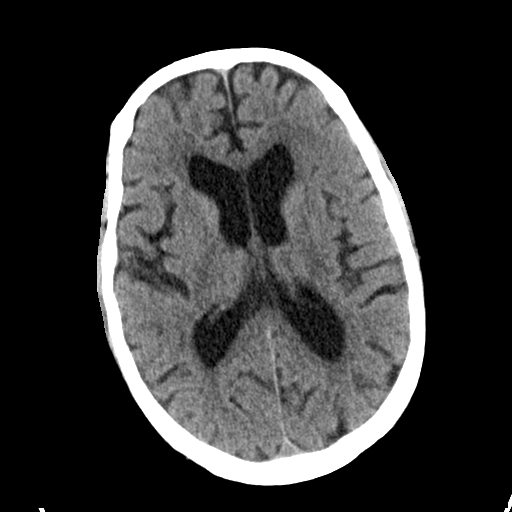
[im 17/33  bone]
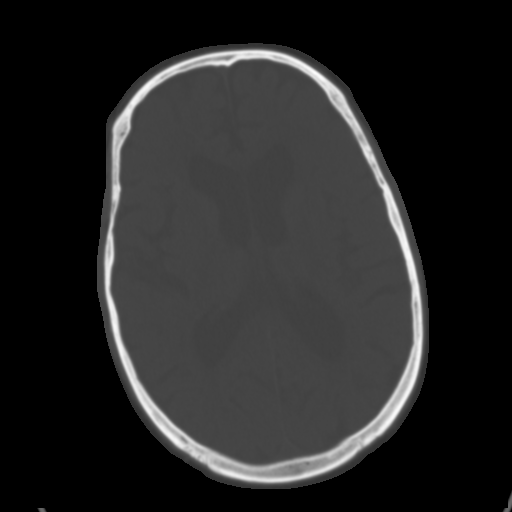
[im 19/33  brain]
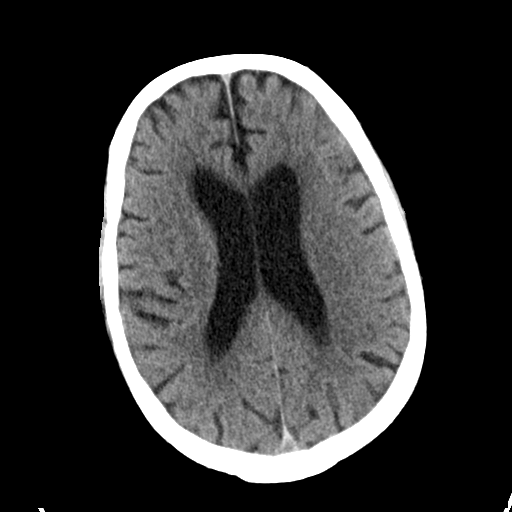
[im 21/33  brain]
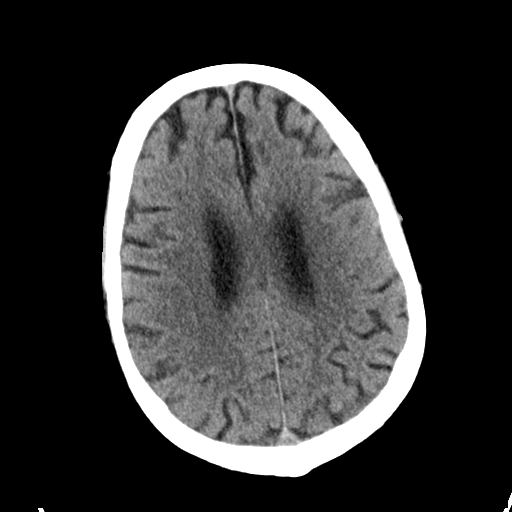
[im 24/33  brain]
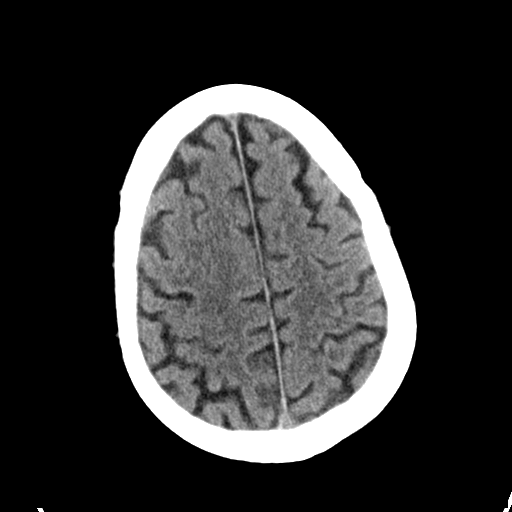
[im 25/33  brain]
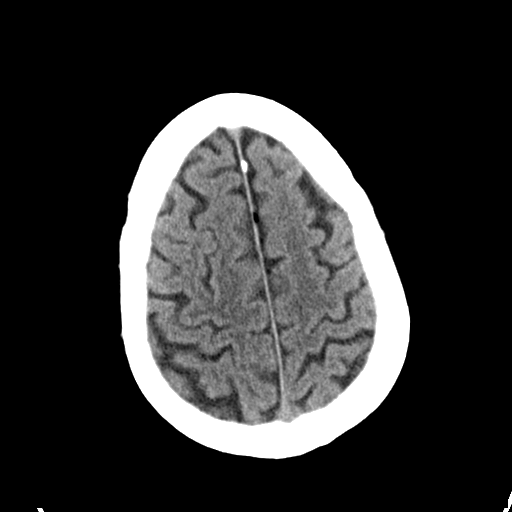
[im 25/33  bone]
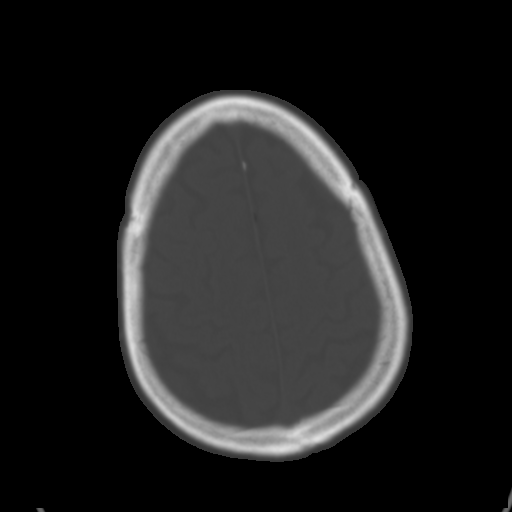
[im 27/33  brain]
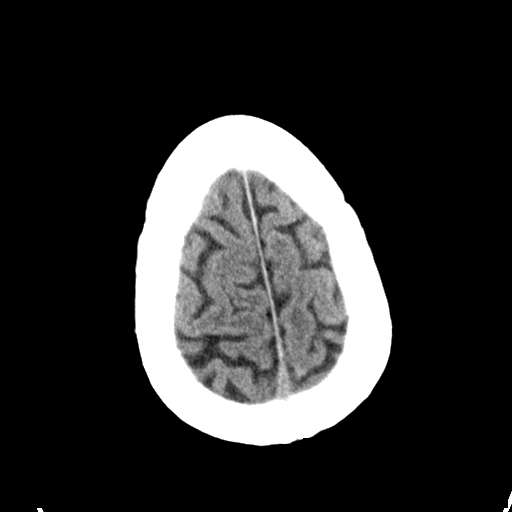
[im 29/33  brain]
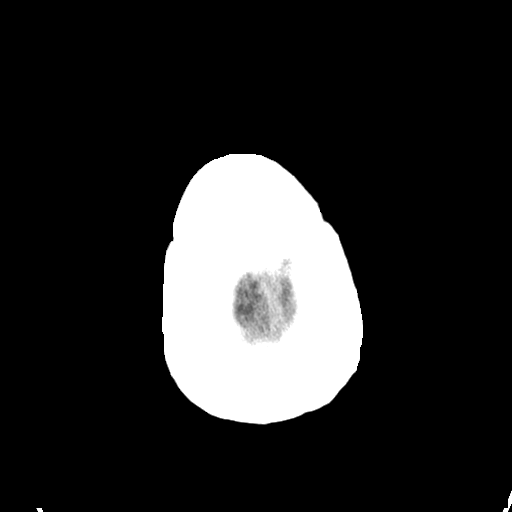
[im 31/33  brain]
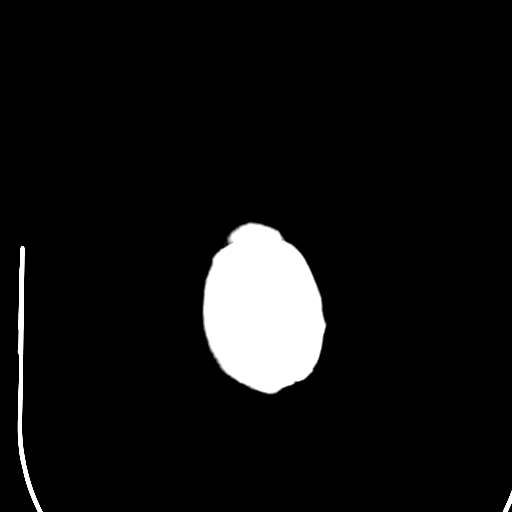

[16 of 30 positions shown; findings below may reference images not displayed]

FINDINGS: Mild atrophy and chronic small-vessel white matter ischemic changes
again identified.

No acute intracranial abnormalities are identified, including mass
lesion or mass effect, hydrocephalus, extra-axial fluid collection,
midline shift, hemorrhage, or acute infarction.

Right maxillary sinus mucosal thickening is identified. The
visualized bony calvarium is unremarkable.
IMPRESSION: No evidence of acute intracranial abnormality.

Atrophy and chronic small-vessel white matter ischemic changes.

Chronic right maxillary sinus disease/sinusitis.

Critical Value/emergent results were called by telephone at the time
of interpretation on 06/22/2015 at [DATE] to Dr. Louishyomin, who
verbally acknowledged these results.
